# Patient Record
Sex: Female | Born: 1985 | Race: Black or African American | Hispanic: No | Marital: Single | Smoking: Current some day smoker
Health system: Southern US, Community
[De-identification: ages and names within clinical notes are randomized; demographics above are authoritative.]

## PROBLEM LIST (undated history)

## (undated) ENCOUNTER — Ambulatory Visit (HOSPITAL_COMMUNITY): Admission: EM | Payer: 59

## (undated) DIAGNOSIS — T7840XA Allergy, unspecified, initial encounter: Secondary | ICD-10-CM

## (undated) HISTORY — PX: EYE MUSCLE SURGERY: SHX370

## (undated) HISTORY — DX: Allergy, unspecified, initial encounter: T78.40XA

---

## 2004-12-30 ENCOUNTER — Emergency Department (HOSPITAL_COMMUNITY): Admission: EM | Admit: 2004-12-30 | Discharge: 2004-12-30 | Payer: Self-pay | Admitting: Emergency Medicine

## 2007-06-07 ENCOUNTER — Emergency Department (HOSPITAL_COMMUNITY): Admission: EM | Admit: 2007-06-07 | Discharge: 2007-06-07 | Payer: Self-pay | Admitting: Emergency Medicine

## 2007-12-29 ENCOUNTER — Emergency Department (HOSPITAL_COMMUNITY): Admission: EM | Admit: 2007-12-29 | Discharge: 2007-12-30 | Payer: Self-pay | Admitting: Emergency Medicine

## 2008-01-06 ENCOUNTER — Encounter: Admission: RE | Admit: 2008-01-06 | Discharge: 2008-01-06 | Payer: Self-pay | Admitting: Chiropractic Medicine

## 2008-10-25 ENCOUNTER — Emergency Department (HOSPITAL_COMMUNITY): Admission: EM | Admit: 2008-10-25 | Discharge: 2008-10-25 | Payer: Self-pay | Admitting: Emergency Medicine
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
## 2010-02-11 ENCOUNTER — Emergency Department (HOSPITAL_COMMUNITY): Admission: EM | Admit: 2010-02-11 | Discharge: 2010-02-11 | Payer: Self-pay | Admitting: Emergency Medicine

## 2010-09-09 ENCOUNTER — Emergency Department (HOSPITAL_COMMUNITY)
Admission: EM | Admit: 2010-09-09 | Discharge: 2010-09-09 | Payer: Self-pay | Source: Home / Self Care | Admitting: Emergency Medicine

## 2010-09-10 ENCOUNTER — Emergency Department (HOSPITAL_COMMUNITY)
Admission: EM | Admit: 2010-09-10 | Discharge: 2010-09-10 | Payer: Self-pay | Source: Home / Self Care | Admitting: Emergency Medicine

## 2010-09-15 LAB — GLUCOSE, CAPILLARY: Glucose-Capillary: 131 mg/dL — ABNORMAL HIGH (ref 70–99)

## 2010-11-17 LAB — CBC
HCT: 39.2 % (ref 36.0–46.0)
Hemoglobin: 12.6 g/dL (ref 12.0–15.0)
MCHC: 32.3 g/dL (ref 30.0–36.0)
MCV: 82.8 fL (ref 78.0–100.0)
Platelets: 292 10*3/uL (ref 150–400)
RBC: 4.73 MIL/uL (ref 3.87–5.11)
RDW: 16 % — ABNORMAL HIGH (ref 11.5–15.5)
WBC: 9.1 10*3/uL (ref 4.0–10.5)

## 2010-11-17 LAB — BASIC METABOLIC PANEL
BUN: 10 mg/dL (ref 6–23)
CO2: 24 mEq/L (ref 19–32)
Calcium: 9 mg/dL (ref 8.4–10.5)
Chloride: 109 mEq/L (ref 96–112)
Creatinine, Ser: 0.92 mg/dL (ref 0.4–1.2)
GFR calc Af Amer: >60 mL/min (ref >60)
GFR calc non Af Amer: >60 mL/min (ref >60)
Glucose, Bld: 98 mg/dL (ref 70–99)
Potassium: 3.9 mEq/L (ref 3.5–5.1)
Sodium: 139 mEq/L (ref 135–145)

## 2010-11-17 LAB — DIFFERENTIAL
Basophils Absolute: 0 10*3/uL (ref 0.0–0.1)
Basophils Relative: 0 % (ref 0–1)
Eosinophils Absolute: 0.2 10*3/uL (ref 0.0–0.7)
Eosinophils Relative: 3 % (ref 0–5)
Lymphocytes Relative: 37 % (ref 12–46)
Lymphs Abs: 3.4 10*3/uL (ref 0.7–4.0)
Monocytes Absolute: 0.7 10*3/uL (ref 0.1–1.0)
Monocytes Relative: 8 % (ref 3–12)
Neutro Abs: 4.7 10*3/uL (ref 1.7–7.7)
Neutrophils Relative %: 52 % (ref 43–77)

## 2010-11-17 LAB — RAPID URINE DRUG SCREEN, HOSP PERFORMED
Amphetamines: NOT DETECTED
Barbiturates: NOT DETECTED
Benzodiazepines: NOT DETECTED
Cocaine: NOT DETECTED
Opiates: NOT DETECTED
Tetrahydrocannabinol: NOT DETECTED

## 2010-11-17 LAB — URINALYSIS, ROUTINE W REFLEX MICROSCOPIC
Bilirubin Urine: NEGATIVE
Glucose, UA: NEGATIVE mg/dL
Hgb urine dipstick: NEGATIVE
Ketones, ur: NEGATIVE mg/dL
Nitrite: NEGATIVE
Protein, ur: NEGATIVE mg/dL
Specific Gravity, Urine: 1.021 (ref 1.005–1.030)
Urobilinogen, UA: 1 mg/dL (ref 0.0–1.0)
pH: 7 (ref 5.0–8.0)

## 2010-11-17 LAB — POCT PREGNANCY, URINE: Preg Test, Ur: NEGATIVE

## 2016-09-06 ENCOUNTER — Emergency Department (HOSPITAL_COMMUNITY)
Admission: EM | Admit: 2016-09-06 | Discharge: 2016-09-06 | Disposition: A | Discharge status: Home / Self Care | Payer: 59 | Attending: Emergency Medicine | Admitting: Emergency Medicine

## 2016-09-06 ENCOUNTER — Encounter (HOSPITAL_COMMUNITY): Payer: Self-pay

## 2016-09-06 DIAGNOSIS — K029 Dental caries, unspecified: Secondary | ICD-10-CM | POA: Insufficient documentation

## 2016-09-06 DIAGNOSIS — K0889 Other specified disorders of teeth and supporting structures: Secondary | ICD-10-CM | POA: Diagnosis present

## 2016-09-06 DIAGNOSIS — K047 Periapical abscess without sinus: Secondary | ICD-10-CM

## 2016-09-06 DIAGNOSIS — Z87891 Personal history of nicotine dependence: Secondary | ICD-10-CM | POA: Diagnosis not present

## 2016-09-06 MED ORDER — DOXYCYCLINE HYCLATE 100 MG PO CAPS
100.0000 mg | ORAL_CAPSULE | Freq: Two times a day (BID) | ORAL | 0 refills | Status: DC
Start: 2016-09-06 — End: 2019-12-21

## 2016-09-06 NOTE — ED Triage Notes (Signed)
Per pt, sore throat, sinus pressure with headache since Thursday.  Unknown for fever at home.

## 2016-09-06 NOTE — ED Provider Notes (Signed)
[facility]-EMERGENCY DEPT Provider Note   CSN: [id number] Arrival date & time: 09/06/16  1001 By signing my name below, I, Levon HedgerElizabeth Hall, attest that this documentation has been prepared under the direction and in the presence of non-physician practitioner, Roma Bierlein Camprubi-Soms, PA-C. Electronically Signed: Levon HedgerElizabeth Hall, Scribe. 09/06/2016. 11:02 AM.   History   Chief Complaint Chief Complaint  Patient presents with  . Sore Throat  . Headache    HPI Whitney Newman is a 31 y.o. female who presents to the Emergency Department complaining of left lower dental pain that radiates to her left sinus, left ear, and throat onset three days ago. She describes her pain as 7/10, constant, aching pain, radiating to the aforementioned areas, that is occasionally relieved by 800 mg ibuprofen. She reports is exacerbated by cold and laying flat. Pt has also taken 4 of her coworkers clindamycin 300mg  capsules in the past three days. Pt is a former smoker, quit several months ago. She is followed by a dentist, but states has not seen them in a few months. She denies any ear drainage, difficulty swallowing, trismus, drooling, rhinorrhea, fevers, chills, CP, SOB, abd pain, N/V/D/C, hematuria, dysuria, myalgias, arthralgias, numbness, tingling, weakness, or any other complaints at this time.  The history is provided by the patient and medical records. No language interpreter was used.  Dental Pain   This is a new problem. The current episode started more than 2 days ago. The problem occurs constantly. The problem has not changed since onset.The pain is at a severity of 7/10. The pain is moderate. Treatments tried: ibuprofen. The treatment provided mild relief.    History reviewed. No pertinent past medical history.  There are no active problems to display for this patient.   History reviewed. No pertinent surgical history.  OB History    No data available      Home Medications    Prior to Admission  medications   Not on File    Family History History reviewed. No pertinent family history.  Social History Social History  Substance Use Topics  . Smoking status: Former Games developermoker  . Smokeless tobacco: Never Used  . Alcohol use Yes     Comment: social     Allergies   Patient has no known allergies.   Review of Systems Review of Systems  Constitutional: Negative for chills and fever.  HENT: Positive for dental problem, ear pain, sinus pain and sore throat. Negative for ear discharge, rhinorrhea and trouble swallowing.   Respiratory: Negative for shortness of breath.   Cardiovascular: Negative for chest pain.  Gastrointestinal: Negative for abdominal pain, constipation, diarrhea, nausea and vomiting.  Genitourinary: Negative for dysuria and hematuria.  Musculoskeletal: Negative for arthralgias and myalgias.  Skin: Negative for color change.  Allergic/Immunologic: Negative for immunocompromised state.  Neurological: Negative for weakness and numbness.  Psychiatric/Behavioral: Negative for confusion.  10 Systems reviewed and are negative for acute change except as noted in the HPI.   Physical Exam Updated Vital Signs BP 147/87 (BP Location: Left Arm)   Pulse 81   Temp 98.2 F (36.8 C) (Oral)   Resp 18   LMP 08/16/2016   SpO2 100%   Physical Exam  Constitutional: She is oriented to person, place, and time. Vital signs are normal. She appears well-developed and well-nourished.  Non-toxic appearance. No distress.  Afebrile, nontoxic, NAD  HENT:  Head: Normocephalic and atraumatic.  Right Ear: Hearing, tympanic membrane, external ear and ear canal normal.  Left Ear: Hearing, tympanic membrane,  external ear and ear canal normal.  Nose: Nose normal.  Mouth/Throat: Uvula is midline, oropharynx is clear and moist and mucous membranes are normal. No trismus in the jaw. Dental caries present. No dental abscesses or uvula swelling. Tonsils are 0 on the right. Tonsils are 0 on the  left. No tonsillar exudate.  Ears are clear bilaterally. Nose clear. Left lower molar #18 decayed with minimal surrounding gingival swelling and erythema , but no definite dental abscess, no evidence of Ludwig's. Oropharynx clear and moist, without uvular swelling or deviation, no trismus or drooling, no tonsillar swelling or erythema, no exudates.    Eyes: Conjunctivae and EOM are normal. Right eye exhibits no discharge. Left eye exhibits no discharge.  Neck: Normal range of motion. Neck supple.  Cardiovascular: Normal rate and intact distal pulses.   Pulmonary/Chest: Effort normal. No respiratory distress.  Abdominal: Normal appearance. She exhibits no distension.  Musculoskeletal: Normal range of motion.  Neurological: She is alert and oriented to person, place, and time. She has normal strength. No sensory deficit.  Skin: Skin is warm, dry and intact. No rash noted.  Psychiatric: She has a normal mood and affect. Her behavior is normal.  Nursing note and vitals reviewed.  ED Treatments / Results  DIAGNOSTIC STUDIES:  Oxygen Saturation is 100% on RA, normal by my interpretation.    COORDINATION OF CARE:  11:00 AM Discussed treatment plan which includes abx with pt at bedside and pt agreed to plan.   Labs (all labs ordered are listed, but only abnormal results are displayed) Labs Reviewed - No data to display  EKG  EKG Interpretation None       Radiology No results found.  Procedures Procedures (including critical care time)  Medications Ordered in ED Medications - No data to display   Initial Impression / Assessment and Plan / ED Course  I have reviewed the triage vital signs and the nursing notes.  Pertinent labs & imaging results that were available during my care of the patient were reviewed by me and considered in my medical decision making (see chart for details).  Clinical Course     31 y.o. female here with Dental pain associated with dental decay and  possible dental infection with patient afebrile, non toxic appearing and swallowing secretions well, no evidence of ludwig's. I gave patient referral to dentist and stressed the importance of dental follow up for ultimate management of dental pain.  I have also discussed reasons to return immediately to the ER.  Patient expresses understanding and agrees with plan.  I will also give doxycycline and advised tylenol/motrin/oragel for pain. Also advised pt to never take abx not rx'd to her, and to finish all of the abx as directed.  I personally performed the services described in this documentation, which was scribed in my presence. The recorded information has been reviewed and is accurate.   Final Clinical Impressions(s) / ED Diagnoses   Final diagnoses:  Pain due to dental caries  Dental infection    New Prescriptions New Prescriptions   DOXYCYCLINE (VIBRAMYCIN) 100 MG CAPSULE    Take 1 capsule (100 mg total) by mouth 2 (two) times daily. One po bid x 7 days     [employer]i [employer]uss, PA-C 09/06/16 1109    Vanetta Mulders, MD 09/08/16 2120

## 2016-09-06 NOTE — Discharge Instructions (Signed)
Apply warm compresses to jaw throughout the day. Take antibiotic until finished. Use tylenol or motrin as needed for pain. Perform salt water swishes to help with pain/swelling. Use over the counter oragel as needed for additional relief. Followup with a dentist is very important for ongoing evaluation and management of recurrent dental pain, call the dentist listed above in the next 24-48 hours to schedule ongoing dental care, or use the list below to find a dentist. Return to emergency department for emergent changing or worsening symptoms.  °

## 2016-09-06 NOTE — ED Notes (Signed)
Bed: WTR8 Expected date:  Expected time:  Means of arrival:  Comments: 

## 2019-08-29 ENCOUNTER — Other Ambulatory Visit: Payer: Self-pay

## 2019-08-29 ENCOUNTER — Encounter: Payer: Self-pay | Admitting: Medical

## 2019-08-29 ENCOUNTER — Ambulatory Visit (INDEPENDENT_AMBULATORY_CARE_PROVIDER_SITE_OTHER): Payer: 59 | Admitting: Medical

## 2019-08-29 ENCOUNTER — Other Ambulatory Visit (HOSPITAL_COMMUNITY)
Admission: RE | Admit: 2019-08-29 | Discharge: 2019-08-29 | Disposition: A | Discharge status: Home / Self Care | Payer: 59 | Source: Ambulatory Visit | Attending: Medical | Admitting: Medical

## 2019-08-29 VITALS — BP 120/84 | HR 87 | Temp 98.0°F | Ht 69.0 in | Wt 251.4 lb

## 2019-08-29 DIAGNOSIS — Z720 Tobacco use: Secondary | ICD-10-CM | POA: Insufficient documentation

## 2019-08-29 DIAGNOSIS — Z131 Encounter for screening for diabetes mellitus: Secondary | ICD-10-CM | POA: Diagnosis not present

## 2019-08-29 DIAGNOSIS — Z0000 Encounter for general adult medical examination without abnormal findings: Secondary | ICD-10-CM

## 2019-08-29 DIAGNOSIS — M542 Cervicalgia: Secondary | ICD-10-CM

## 2019-08-29 DIAGNOSIS — Z113 Encounter for screening for infections with a predominantly sexual mode of transmission: Secondary | ICD-10-CM | POA: Diagnosis present

## 2019-08-29 DIAGNOSIS — Z124 Encounter for screening for malignant neoplasm of cervix: Secondary | ICD-10-CM | POA: Diagnosis present

## 2019-08-29 DIAGNOSIS — Z7185 Encounter for immunization safety counseling: Secondary | ICD-10-CM | POA: Insufficient documentation

## 2019-08-29 DIAGNOSIS — Z13220 Encounter for screening for lipoid disorders: Secondary | ICD-10-CM

## 2019-08-29 DIAGNOSIS — Z2821 Immunization not carried out because of patient refusal: Secondary | ICD-10-CM | POA: Insufficient documentation

## 2019-08-29 DIAGNOSIS — D649 Anemia, unspecified: Secondary | ICD-10-CM

## 2019-08-29 DIAGNOSIS — Z7189 Other specified counseling: Secondary | ICD-10-CM

## 2019-08-29 DIAGNOSIS — Z833 Family history of diabetes mellitus: Secondary | ICD-10-CM

## 2019-08-29 DIAGNOSIS — M62838 Other muscle spasm: Secondary | ICD-10-CM

## 2019-08-29 LAB — POCT URINALYSIS DIP (PROADVANTAGE DEVICE)
Bilirubin, UA: NEGATIVE
Blood, UA: NEGATIVE
Glucose, UA: NEGATIVE mg/dL
Ketones, POC UA: NEGATIVE mg/dL
Leukocytes, UA: NEGATIVE
Nitrite, UA: NEGATIVE
Protein Ur, POC: NEGATIVE mg/dL
Specific Gravity, Urine: 1.02
Urobilinogen, Ur: NEGATIVE
pH, UA: 6 (ref 5.0–8.0)

## 2019-08-29 NOTE — Patient Instructions (Signed)
Preventative Care for Adults - Female   Thank you for coming in for your well visit today, and thank you for trusting [place] with your care!   Maintain regular health and wellness exams:  A routine yearly physical is a good way to check in with your primary care provider about your health and preventive screening. It is also an opportunity to share updates about your health and any concerns you have, and receive a thorough all-over exam.   Most health insurance companies pay for at least some preventative services.  Check with your health plan for specific coverages.  What preventative services do women need?  Adult women should have their weight and blood pressure checked regularly.   Women age 23 and older should have their cholesterol levels checked regularly.  Women should be screened for cervical cancer with a Pap smear and pelvic exam beginning at either age 46, or 3 years after they become sexually activity.    Breast cancer screening generally begins at age 80 with a mammogram and breast exam by your primary care provider.    Beginning at age 63 and continuing to age 76, women should be screened for colorectal cancer.  Certain people may need continued testing until age 64.  Updating vaccinations is part of preventative care.  Vaccinations help protect against diseases such as the flu.  Osteoporosis is a disease in which the bones lose minerals and strength as we age. Women ages 53 and over should discuss this with their caregivers, as should women after menopause who have other risk factors.  Lab tests are generally done as part of preventative care to screen for anemia and blood disorders, to screen for problems with the kidneys and liver, to screen for bladder problems, to check blood sugar, and to check your cholesterol level.  Preventative services generally include counseling about diet, exercise, avoiding tobacco, drugs, excessive alcohol consumption, and sexually transmitted  infections.    Xrays and CT scans are not normally done as a preventative test, and most insurances do not pay for imaging for screening other than as discussed under cancer screens below.   On the other hand, if you have certain medical concerns, imaging may be necessary as a diagnostic test.   Your Medical Team Your medical team starts with [place], your PCP or primary care provider.  Please use our services for your routine care such as physicals, screenings, immunizations, sick visits, and your first stop for general medical concerns.  You can call our number for after hours information for urgent questions that may need attention but cannot wait til the next business day.    Urgent care-urgent cares exist to provide care when your primary care office would typically be closed such as evenings or weekends.   Urgent care is for evaluation of urgent medical problems that do not necessarily require emergency department care, but cannot wait til the next business day when we are open.  Emergency department care-please reserve emergency department care for serious, urgent, possibly life-threatening medical problems.  This includes issues like possible stroke, heart attack, significant injury, mental health crisis, or other urgent need that requires immediate medical attention.     See your dentist office twice yearly for hygiene and cleaning visits.   Brush your teeth and floss your teeth daily.  See your eye doctor yearly for routine eye exam and screenings for glaucoma and retinal disease.  See your gynecologist yearly if you do not have your female/gynecological exams at our  office.     Specific Concerns: Establish with an ophthalmologist for eye care.    Check with [facility] or [facility] care to start with    Vaccines:  Stay up to date with your tetanus shots and other required immunizations. You should have a booster for tetanus every 10 years. Be sure to get your flu shot every year, since  5%-20% of the [place] population comes down with the flu. The flu vaccine changes each year, so being vaccinated once is not enough. Get your shot in the fall, before the flu season peaks.   Other vaccines to consider:  Pneumococcal vaccine to protect against certain types of pneumonia.  This is normally recommended for adults age 60 or older.  However, adults younger than 35 years old with certain underlying conditions such as diabetes, heart or lung disease should also receive the vaccine.  Shingles vaccine to protect against Varicella Zoster if you are older than age 86, or younger than 35 years old with certain underlying illness.  If you have not had the Shingrix vaccine, please call your insurer to inquire about coverage for the Shingrix vaccine given in 2 doses.   Some insurers cover this vaccine after age 52, some cover this after age 56.  If your insurer covers this, then call to schedule appointment to have this vaccine here  Hepatitis A vaccine to protect against a form of infection of the liver by a virus acquired from food.  Hepatitis B vaccine to protect against a form of infection of the liver by a virus acquired from blood or body fluids, particularly if you work in health care.  If you plan to travel internationally, check with your local health department for specific vaccination recommendations.  Human Papilloma Virus or HPV causes cancer of the cervix, and other infections that can be transmitted from person to person. There is a vaccine for HPV, and males should get immunized between the ages of 35 and 59. It requires a series of 3 shots.   Covid/Coronavirus - as the vaccines are becoming available, please consider vaccination if you are a health care worker, first responder, or have significant health problems such as asthma, COPD, heart disease, hypertension, diabetes, obesity, multiple medical problems, over age 36yo, or immunocompromised.     I recommend the following  vaccines: A yearly flu shot which you declined today  I recommend a one time pneumococcal vaccine  Get [place] a copy of your last tetanus booster    What should I know about Cancer screening? Many types of cancers can be detected early and may often be prevented. Lung Cancer  You should be screened every year for lung cancer if: ? You are a current smoker who has smoked for at least 30 years. ? You are a former smoker who has quit within the past 15 years.  Talk to your health care provider about your screening options, when you should start screening, and how often you should be screened.  Breast cancer screening is essential to preventive care for women. All women age 45 and older should perform a breast self-exam every month. At age 90 and older, women should have their caregiver complete a breast exam each year. Women at ages 61 and older should have a mammogram (x-ray film) of the breasts. Your caregiver can discuss how often you need mammograms.    Breast cancer screening   [facility]   Or    [facility]  [phone]         [phone], [address]       [address] [place], [place] [address]        [place] [address]  Cervical cancer screening includes taking a Pap smear (sample of cells examined under a microscope) from the cervix (end of the uterus). It also includes testing for HPV (Human Papilloma Virus, which can cause cervical cancer). Screening and a pelvic exam should begin at age 35, or 3 years after a woman becomes sexually active. Screening should occur every year, with a Pap smear but no HPV testing, up to age 35. After age 35, you should have a Pap smear every 3 years with HPV testing, if no HPV was found previously.   Colorectal Cancer  Routine colorectal cancer screening usually begins at 35 years of age and should be repeated every 5-10 years until you are 35 years old. You may need to be  screened more often if early forms of precancerous polyps or small growths are found. Your health care provider may recommend screening at an earlier age if you have risk factors for colon cancer.  Your health care provider may recommend using home test kits to check for hidden blood in the stool.  A small camera at the end of a tube can be used to examine your colon (sigmoidoscopy or colonoscopy). This checks for the earliest forms of colorectal cancer.  Skin Cancer  Check your skin from head to toe regularly.  Tell your health care provider about any new moles or changes in moles, especially if: ? There is a change in a mole's size, shape, or color. ? You have a mole that is larger than a pencil eraser.  Always use sunscreen. Apply sunscreen liberally and repeat throughout the day.  Protect yourself by wearing long sleeves, pants, a wide-brimmed hat, and sunglasses when outside.   Are you up to date on cancer screenings:  COLON CANCER SCREENING:  N/a  BREAST CANCER SCREENING:  Monthly self breast exam recommended  CERVICAL CANCER SCREENING:  Pap smear done today OVARIAN CANCER SCREENING:  since there are no swabs for this screening, a clinical pelvic exam is needed for screening.  It is recommended to have this done yearly or regularly as part of your exam.     GENERAL RECOMMENDATIONS FOR GOOD HEALTH:  Healthy diet:  Eat a variety of foods, including fruit, vegetables, animal or vegetable protein, such as meat, fish, chicken, and eggs, or beans, lentils, tofu, and grains, such as rice.  Drink plenty of water daily.  Decrease saturated fat in the diet, avoid lots of red meat, processed foods, sweets, fast foods, and fried foods.  Exercise:  Aerobic exercise helps maintain good heart health. At least 30-40 minutes of moderate-intensity exercise is recommended. For example, a brisk walk that increases your heart rate and breathing. This should be done on most days of the week.    Find a type of exercise or a variety of exercises that you enjoy so that it becomes a part of your daily life.  Examples are running, walking, swimming, water aerobics, and biking.  For motivation and support, explore group exercise such as aerobic class, spin class, Zumba, Yoga,or  martial arts, etc.    Set exercise goals for yourself, such as a certain weight goal, walk or run in a race such as a 5k walk/run.  Speak to your primary care provider about exercise goals.  Your weight readings per our records: Wt Readings from Last 3 Encounters:  08/29/19 251 lb 6.4 oz (114 kg)    Body mass index is 37.13 kg/m.    Disease prevention:  If you smoke or chew tobacco, find out from your caregiver how to quit. It can literally save your life, no matter how long you have been a tobacco user. If you do not use tobacco, never begin.   Maintain a healthy diet and normal weight. Increased weight leads to problems with blood pressure and diabetes.   The Body Mass Index or BMI is a way of measuring how much of your body is fat. Having a BMI above 27 increases the risk of heart disease, diabetes, hypertension, stroke and other problems related to obesity. Your caregiver can help determine your BMI and based on it develop an exercise and dietary program to help you achieve or maintain this important measurement at a healthful level.  High blood pressure causes heart and blood vessel problems.  Persistent high blood pressure should be treated with medicine if weight loss and exercise do not work.  Your blood pressure readings per our records:     BP Readings from Last 3 Encounters:  08/29/19 120/84  09/06/16 118/82     Fat and cholesterol leaves deposits in your arteries that can block them. This causes heart disease and vessel disease elsewhere in your body.  If your cholesterol is found to be high, or if you have heart disease or certain other medical conditions, then you may need to have your  cholesterol monitored frequently and be treated with medication.   Ask if you should have a cardiac stress test if your history suggests this. A stress test is a test done on a treadmill that looks for heart disease. This test can find disease prior to there being a problem.    Heart disease screening:  You are having no symptoms, vital signs are normal   Menopause can be associated with physical symptoms and risks. Hormone replacement therapy is available to decrease these. You should talk to your caregiver about whether starting or continuing to take hormones is right for you.   Osteoporosis is a disease in which the bones lose minerals and strength as we age. This can result in serious bone fractures. Risk of osteoporosis can be identified using a bone density scan. Women ages 12 and over should discuss this with their caregivers, as should women after menopause who have other risk factors. Ask your caregiver whether you should be taking a calcium supplement and Vitamin D, to reduce the rate of osteoporosis.   Osteoporosis screening/bone density testing:  [facility]   [phone]          [phone] [address], [place], [address] [place], [place] [address]        [place], [place] [address]    Avoid drinking alcohol in excess (more than two drinks per day).  Avoid use of street drugs. Do not share needles with anyone. Ask for professional help if you need assistance or instructions on stopping the use of alcohol, cigarettes, and/or drugs.  Brush your teeth twice a day with fluoride toothpaste, and floss once a day. Good oral hygiene prevents tooth decay and gum disease. The problems can be painful, unattractive, and can cause other health problems. Visit your dentist for a routine oral  and dental check up and preventive care every 6-12 months.   Safety:  Use seatbelts 100% of the time, whether driving or  as a passenger.  Use safety devices such as hearing protection if you work in environments with loud noise or significant background noise.  Use safety glasses when doing any work that could send debris in to the eyes.  Use a helmet if you ride a bike or motorcycle.  Use appropriate safety gear for contact sports.  Talk to your caregiver about gun safety.  Use sunscreen with a SPF (or skin protection factor) of 15 or greater.  Lighter skinned people are at a greater risk of skin cancer. Don't forget to also wear sunglasses in order to protect your eyes from too much damaging sunlight. Damaging sunlight can accelerate cataract formation.   Keep carbon monoxide and smoke detectors in your home functioning at all times. Change the batteries every 6 months or use a model that plugs into the wall.    Sexual activity: . Sex is a normal part of life and sexual activity can continue into older adulthood for many healthy people.   . If you are having issues related to sexual activity, please follow up to discuss this further.   . If you are not in a monogamous relationship or have more than one partner, please practice safe sex.  Use condoms. Condoms are used for birth control and to help reduce the spread of sexually transmitted infections (or STIs).  Some of the STIs are gonorrhea (the clap), chlamydia, syphilis, trichomonas, herpes, HPV (human papilloma virus) and HIV (human immunodeficiency virus) which causes AIDS. The herpes, HIV and HPV are viral illnesses that have no cure. These can result in disability, cancer and death.   We are able to test for STIs here at our office.

## 2019-08-29 NOTE — Progress Notes (Signed)
Subjective:   HPI  Whitney Newman is a 33 y.o. female who presents for Chief Complaint  Patient presents with  . Annual Exam    with fasting labs   . Neck Pain    right side behind right ear-was seen virtually for it     Patient Care Team: Patient, No Pcp Per as PCP - General (General Practice) Sees dentist Sees eye doctor  Concerns: No primary care in years since teenage years.  Was recently seen by virtual visit for neck pain, was given steroid and muscle relaxer.  She finished the steroid.  Has hx/o eye muscle issue, 2 prior surgeries, and will likely need another eye surgery.  Fasting today for labs.   Thinks she had Tetanus within 10 years during prior arrest.    Gynecological history:  no pregnancies, has female partner x several years.  Not currently sexually active.   Reviewed their medical, surgical, family, social, medication, and allergy history and updated chart as appropriate.  Past Medical History:  Diagnosis Date  . Allergy     Past Surgical History:  Procedure Laterality Date  . EYE MUSCLE SURGERY     x2    Social History   Socioeconomic History  . Marital status: Single    Spouse name: Not on file  . Number of children: Not on file  . Years of education: Not on file  . Highest education level: Not on file  Occupational History  . Not on file  Tobacco Use  . Smoking status: Current Some Day Smoker    Packs/day: 0.25  . Smokeless tobacco: Never Used  Substance and Sexual Activity  . Alcohol use: Yes    Alcohol/week: 3.0 standard drinks    Types: 3 Shots of liquor per week    Comment: social  . Drug use: No  . Sexual activity: Not on file  Other Topics Concern  . Not on file  Social History Narrative   Lives with girlfriend and her 2 children.   Works in a Nurse, learning disability.   Christian.  Exercise - work on the job.     08/2019   Social Determinants of Health   Financial Resource Strain:   . Difficulty of Paying Living  Expenses: Not on file  Food Insecurity:   . Worried About Charity fundraiser in the Last Year: Not on file  . Ran Out of Food in the Last Year: Not on file  Transportation Needs:   . Lack of Transportation (Medical): Not on file  . Lack of Transportation (Non-Medical): Not on file  Physical Activity:   . Days of Exercise per Week: Not on file  . Minutes of Exercise per Session: Not on file  Stress:   . Feeling of Stress : Not on file  Social Connections:   . Frequency of Communication with Friends and Family: Not on file  . Frequency of Social Gatherings with Friends and Family: Not on file  . Attends Religious Services: Not on file  . Active Member of Clubs or Organizations: Not on file  . Attends Archivist Meetings: Not on file  . Marital Status: Not on file  Intimate Partner Violence:   . Fear of Current or Ex-Partner: Not on file  . Emotionally Abused: Not on file  . Physically Abused: Not on file  . Sexually Abused: Not on file    Family History  Problem Relation Age of Onset  . Hypertension Mother   . Arthritis  Mother   . Sleep apnea Mother   . Liver disease Mother   . Hypertension Father   . Diabetes Father   . Kidney disease Father   . Arthritis Father   . Liver disease Sister   . Sleep apnea Brother   . Cancer Maternal Grandmother        cervical  . Cancer Paternal Grandfather        throat  . Sleep apnea Sister   . Hypertension Sister   . Diabetes Sister      Current Outpatient Medications:  .  methocarbamol (ROBAXIN) 500 MG tablet, Take 1,000 mg by mouth 4 (four) times daily., Disp: , Rfl:  .  doxycycline (VIBRAMYCIN) 100 MG capsule, Take 1 capsule (100 mg total) by mouth 2 (two) times daily. One po bid x 7 days (Patient not taking: Reported on 08/29/2019), Disp: 14 capsule, Rfl: 0  No Known Allergies    Review of Systems Constitutional: -fever, -chills, -sweats, -unexpected weight change, -decreased appetite, -fatigue Allergy:  -sneezing, -itching, -congestion Dermatology: -changing moles, --rash, -lumps ENT: -runny nose, -ear pain, -sore throat, -hoarseness, -sinus pain, -teeth pain, - ringing in ears, -hearing loss, -nosebleeds Cardiology: -chest pain, -palpitations, -swelling, -difficulty breathing when lying flat, -waking up short of breath Respiratory: -cough, -shortness of breath, -difficulty breathing with exercise or exertion, -wheezing, -coughing up blood Gastroenterology: -abdominal pain, -nausea, -vomiting, -diarrhea, -constipation, -blood in stool, -changes in bowel movement, -difficulty swallowing or eating Hematology: -bleeding, -bruising  Musculoskeletal: -joint aches, -muscle aches, -joint swelling, -back pain, +neck pain, -cramping, -changes in gait Ophthalmology: denies vision changes, eye redness, itching, discharge Urology: -burning with urination, -difficulty urinating, -blood in urine, -urinary frequency, -urgency, -incontinence Neurology: -headache, -weakness, -tingling, -numbness, -memory loss, -falls, -dizziness Psychology: -depressed mood, -agitation, -sleep problems Breast/gyn: -breast tendnerss, -discharge, -lumps, -vaginal discharge,- irregular periods, -heavy periods     Objective:  BP 120/84   Pulse 87   Temp 98 F (36.7 C)   Ht 5\' 9"  (1.753 m)   Wt 251 lb 6.4 oz (114 kg)   SpO2 100%   BMI 37.13 kg/m   General appearance: alert, no distress, WD/WN, African American female Skin: right dorsal hand just medial to thumb base with 1 cm healing burn wound, otherwise no worrisome lesions HEENT: normocephalic, conjunctiva/corneas normal, sclerae anicteric, PERRLA, EOMi, nares patent, no discharge or erythema, pharynx normal Oral cavity: MMM, tongue normal, teeth normal Neck: tender right lateral neck muscles and base of occiput on right, mild pain with ROM which is relatively full.   No deformity.   otherwise supple, no lymphadenopathy, no thyromegaly, no masses, normal ROM, no  bruits Chest: non tender, normal shape and expansion Heart: RRR, normal S1, S2, no murmurs Lungs: CTA bilaterally, no wheezes, rhonchi, or rales Abdomen: +bs, soft, non tender, non distended, no masses, no hepatomegaly, no splenomegaly, no bruits Back: tender right upper back paraspinal region.  non tender, normal ROM, no scoliosis Musculoskeletal: upper extremities non tender, no obvious deformity, normal ROM throughout, lower extremities non tender, no obvious deformity, normal ROM throughout Extremities: no edema, no cyanosis, no clubbing Pulses: 2+ symmetric, upper and lower extremities, normal cap refill Neurological: alert, oriented x 3, CN2-12 intact, strength normal upper extremities and lower extremities, sensation normal throughout, DTRs 2+ throughout, no cerebellar signs, gait normal Psychiatric: normal affect, behavior normal, pleasant  Breast: nontender, no masses or lumps, no skin changes, no nipple discharge or inversion, no axillary lymphadenopathy Gyn: Normal external genitalia without lesions, vagina with normal mucosa,  cervix without lesions, no cervical motion tenderness, no abnormal vaginal discharge.  Questionable fullness right adnexa, otherwise Uterus and left adnexa not enlarged, nontender, no masses.  Pap performed.  Exam chaperoned by nurse. Rectal: anus normal appearing   Assessment and Plan :   Encounter Diagnoses  Name Primary?  . Encounter for health maintenance examination in adult Yes  . Screening for lipid disorders   . Screening for diabetes mellitus   . Family history of diabetes mellitus (DM)   . Screening for cervical cancer   . Screen for STD (sexually transmitted disease)   . Neck pain   . Neck muscle spasm   . Vaccine counseling   . Influenza vaccination declined   . Tobacco use     Physical exam - discussed and counseled on healthy lifestyle, diet, exercise, preventative care, vaccinations, sick and well care, proper use of emergency dept and  after hours care, and addressed their concerns.    Health screening: Advised they see their eye doctor yearly for routine vision care. Advised they see their dentist yearly for routine dental care including hygiene visits twice yearly.  Discussed STD testing, discussed prevention, condom use, means of transmission  Cancer screening Counseled on self breast exams, mammograms, cervical cancer screening   Vaccinations: Advised yearly influenza vaccine Patient declines influenza vaccine  She notes being up to date on Td vaccine  Separate significant issues discussed: Neck pain and spasm - advised stretching, can c/t robaxin prn, can use heat.  Likely referral to PT pending labs  Advised to stop tobacco.   Sloane was seen today for annual exam and neck pain.  Diagnoses and all orders for this visit:  Encounter for health maintenance examination in adult -     Comprehensive metabolic panel -     CBC with Differential -     Lipid Panel -     Hemoglobin A1c -     HIV regular -     RPR regular -     Hepatitis C antibody -     Hepatitis B surface antigen -     Cytology - PAP([place]) -     TSH regular  Screening for lipid disorders -     Lipid Panel  Screening for diabetes mellitus -     Hemoglobin A1c  Family history of diabetes mellitus (DM) -     Hemoglobin A1c  Screening for cervical cancer -     Cytology - PAP([place])  Screen for STD (sexually transmitted disease) -     HIV regular -     RPR regular -     Hepatitis C antibody -     Hepatitis B surface antigen -     Cytology - PAP([place])  Neck pain  Neck muscle spasm  Vaccine counseling  Influenza vaccination declined  Tobacco use    Follow-up pending labs, yearly for physical

## 2019-08-29 NOTE — Addendum Note (Signed)
Addended by: Edgar Frisk on: 08/29/2019 10:41 AM   Modules accepted: Orders

## 2019-08-30 ENCOUNTER — Other Ambulatory Visit: Payer: Self-pay | Admitting: Medical

## 2019-08-30 LAB — COMPREHENSIVE METABOLIC PANEL
ALT: 7 IU/L (ref 0–32)
AST: 13 IU/L (ref 0–40)
Albumin/Globulin Ratio: 1.2 (ref 1.2–2.2)
Albumin: 4.1 g/dL (ref 3.8–4.8)
Alkaline Phosphatase: 65 IU/L (ref 39–117)
BUN/Creatinine Ratio: 13 (ref 9–23)
BUN: 12 mg/dL (ref 6–20)
Bilirubin Total: 0.2 mg/dL (ref 0.0–1.2)
CO2: 22 mmol/L (ref 20–29)
Calcium: 9.3 mg/dL (ref 8.7–10.2)
Chloride: 106 mmol/L (ref 96–106)
Creatinine, Ser: 0.92 mg/dL (ref 0.57–1.00)
GFR calc Af Amer: 93 mL/min/{1.73_m2} (ref >59)
GFR calc non Af Amer: 81 mL/min/{1.73_m2} (ref >59)
Globulin, Total: 3.3 g/dL (ref 1.5–4.5)
Glucose: 86 mg/dL (ref 65–99)
Potassium: 4.6 mmol/L (ref 3.5–5.2)
Sodium: 138 mmol/L (ref 134–144)
Total Protein: 7.4 g/dL (ref 6.0–8.5)

## 2019-08-30 LAB — CBC WITH DIFFERENTIAL/PLATELET
Basophils Absolute: 0.1 10*3/uL (ref 0.0–0.2)
Basos: 1 %
EOS (ABSOLUTE): 0.1 10*3/uL (ref 0.0–0.4)
Eos: 2 %
Hematocrit: 28.8 % — ABNORMAL LOW (ref 34.0–46.6)
Hemoglobin: 7.6 g/dL — ABNORMAL LOW (ref 11.1–15.9)
Immature Grans (Abs): 0 10*3/uL (ref 0.0–0.1)
Immature Granulocytes: 0 %
Lymphocytes Absolute: 2.8 10*3/uL (ref 0.7–3.1)
Lymphs: 44 %
MCH: 17 pg — ABNORMAL LOW (ref 26.6–33.0)
MCHC: 26.4 g/dL — ABNORMAL LOW (ref 31.5–35.7)
MCV: 65 fL — ABNORMAL LOW (ref 79–97)
Monocytes Absolute: 0.7 10*3/uL (ref 0.1–0.9)
Monocytes: 11 %
Neutrophils Absolute: 2.6 10*3/uL (ref 1.4–7.0)
Neutrophils: 42 %
Platelets: 679 10*3/uL — ABNORMAL HIGH (ref 150–450)
RBC: 4.46 x10E6/uL (ref 3.77–5.28)
RDW: 19.1 % — ABNORMAL HIGH (ref 11.7–15.4)
WBC: 6.3 10*3/uL (ref 3.4–10.8)

## 2019-08-30 LAB — CYTOLOGY - PAP
Chlamydia: NEGATIVE
Comment: NEGATIVE
Comment: NEGATIVE
Comment: NORMAL
Diagnosis: NEGATIVE
High risk HPV: NEGATIVE
Neisseria Gonorrhea: NEGATIVE

## 2019-08-30 LAB — HEMOGLOBIN A1C
Est. average glucose Bld gHb Est-mCnc: 117 mg/dL
Hgb A1c MFr Bld: 5.7 % — ABNORMAL HIGH (ref 4.8–5.6)

## 2019-08-30 LAB — LIPID PANEL
Chol/HDL Ratio: 3.8 ratio (ref 0.0–4.4)
Cholesterol, Total: 204 mg/dL — ABNORMAL HIGH (ref 100–199)
HDL: 54 mg/dL (ref >39)
LDL Chol Calc (NIH): 141 mg/dL — ABNORMAL HIGH (ref 0–99)
Triglycerides: 48 mg/dL (ref 0–149)
VLDL Cholesterol Cal: 9 mg/dL (ref 5–40)

## 2019-08-30 LAB — HEPATITIS C ANTIBODY: Hep C Virus Ab: 0.1 s/co ratio (ref 0.0–0.9)

## 2019-08-30 LAB — HEPATITIS B SURFACE ANTIGEN: Hepatitis B Surface Ag: NEGATIVE

## 2019-08-30 LAB — TSH: TSH: 1.91 u[IU]/mL (ref 0.450–4.500)

## 2019-08-30 LAB — RPR: RPR Ser Ql: NONREACTIVE

## 2019-08-30 LAB — HIV ANTIBODY (ROUTINE TESTING W REFLEX): HIV Screen 4th Generation wRfx: NONREACTIVE

## 2019-08-30 MED ORDER — FERROUS GLUCONATE 324 (38 FE) MG PO TABS: 324.0000 mg | ORAL_TABLET | Freq: Three times a day (TID) | ORAL | 3 refills | Status: DC

## 2019-08-31 ENCOUNTER — Other Ambulatory Visit: Payer: Self-pay | Admitting: Medical

## 2019-08-31 MED ORDER — METRONIDAZOLE 500 MG PO TABS: ORAL_TABLET | ORAL | 0 refills | Status: DC

## 2019-09-05 DIAGNOSIS — D649 Anemia, unspecified: Secondary | ICD-10-CM | POA: Insufficient documentation

## 2019-09-07 LAB — SPECIMEN STATUS REPORT

## 2019-09-07 LAB — FERRITIN: Ferritin: 2 ng/mL — ABNORMAL LOW (ref 15–150)

## 2019-09-07 LAB — VITAMIN B12: Vitamin B-12: 442 pg/mL (ref 232–1245)

## 2019-09-07 LAB — IRON: Iron: 12 ug/dL — ABNORMAL LOW (ref 27–159)

## 2019-12-20 ENCOUNTER — Ambulatory Visit
Admission: RE | Admit: 2019-12-20 | Discharge: 2019-12-20 | Disposition: A | Discharge status: Home / Self Care | Payer: 59 | Source: Ambulatory Visit | Attending: Medical | Admitting: Medical

## 2019-12-20 ENCOUNTER — Ambulatory Visit: Payer: 59 | Admitting: Medical

## 2019-12-20 ENCOUNTER — Other Ambulatory Visit: Payer: Self-pay

## 2019-12-20 ENCOUNTER — Encounter: Payer: Self-pay | Admitting: Medical

## 2019-12-20 VITALS — BP 120/88 | HR 78 | Temp 98.1°F | Ht 69.0 in | Wt 254.6 lb

## 2019-12-20 DIAGNOSIS — R102 Pelvic and perineal pain: Secondary | ICD-10-CM | POA: Diagnosis not present

## 2019-12-20 DIAGNOSIS — N939 Abnormal uterine and vaginal bleeding, unspecified: Secondary | ICD-10-CM | POA: Diagnosis not present

## 2019-12-20 DIAGNOSIS — F17200 Nicotine dependence, unspecified, uncomplicated: Secondary | ICD-10-CM

## 2019-12-20 DIAGNOSIS — R1900 Intra-abdominal and pelvic swelling, mass and lump, unspecified site: Secondary | ICD-10-CM | POA: Diagnosis not present

## 2019-12-20 DIAGNOSIS — D509 Iron deficiency anemia, unspecified: Secondary | ICD-10-CM | POA: Insufficient documentation

## 2019-12-20 LAB — POCT URINALYSIS DIP (PROADVANTAGE DEVICE)
Bilirubin, UA: NEGATIVE
Glucose, UA: NEGATIVE mg/dL
Ketones, POC UA: NEGATIVE mg/dL
Leukocytes, UA: NEGATIVE
Nitrite, UA: NEGATIVE
Specific Gravity, Urine: 1.03
Urobilinogen, Ur: NEGATIVE
pH, UA: 6 (ref 5.0–8.0)

## 2019-12-20 LAB — CBC WITH DIFFERENTIAL/PLATELET
EOS (ABSOLUTE): 0.1 10*3/uL (ref 0.0–0.4)
Hematocrit: 35.4 % (ref 34.0–46.6)

## 2019-12-20 NOTE — Addendum Note (Signed)
Addended by: Victorio Palm on: 12/20/2019 10:42 AM   Modules accepted: Orders

## 2019-12-20 NOTE — Progress Notes (Signed)
Subjective: Chief Complaint  Patient presents with  . Vaginal Bleeding    bleeding on and off-not bleeding today    Here for vaginal bleeding.     LMP was 3 weeks ago.  Prior to 3 weeks ago, the last several months she had normal period.    Those were normal in timing and not heavy.   After most recent period, once the menstrual period was over, then a day later bleeding restarted.  Has been bleeding since.  Sometimes spotty, sometimes regular bleeding.  No prior similar.   Has same female sexual partner x 7 years, no concern for STD.    Takes iron on average 4 days per week.  No recent change in diet, exercise.     Sometimes smoker.  Not on OCPs.     No other bleeding or bruising  She was anemic in 08/2019. We started her on iron and she was suppose to return in 1 month but didn't return until now to recheck on this.      Past Medical History:  Diagnosis Date  . Allergy    Current Outpatient Medications on File Prior to Visit  Medication Sig Dispense Refill  . ferrous gluconate (FERGON) 324 MG tablet Take 1 tablet (324 mg total) by mouth 3 (three) times daily with meals. 90 tablet 3  . doxycycline (VIBRAMYCIN) 100 MG capsule Take 1 capsule (100 mg total) by mouth 2 (two) times daily. One po bid x 7 days (Patient not taking: Reported on 08/29/2019) 14 capsule 0  . methocarbamol (ROBAXIN) 500 MG tablet Take 1,000 mg by mouth 4 (four) times daily.    . metroNIDAZOLE (FLAGYL) 500 MG tablet 4 tablets once, don't consume alcohol while taking the medication (Patient not taking: Reported on 12/20/2019) 4 tablet 0   No current facility-administered medications on file prior to visit.   Past Surgical History:  Procedure Laterality Date  . EYE MUSCLE SURGERY     x2    ROS as in subjective    Objective: BP 120/88   Pulse 78   Temp 98.1 F (36.7 C)   Ht 5\' 9"  (1.753 m)   Wt 254 lb 9.6 oz (115.5 kg)   SpO2 98%   BMI 37.60 kg/m   Wt Readings from Last 3 Encounters:  12/20/19  254 lb 9.6 oz (115.5 kg)  08/29/19 251 lb 6.4 oz (114 kg)   Gen: wd, wn, na Abdomen: +bs, soft, mild right lower pelvic tendnerss, otherwise nontender, no mass, no organomegaly Back nontender      Assessment: Encounter Diagnoses  Name Primary?  . Vaginal bleeding Yes  . Iron deficiency anemia, unspecified iron deficiency anemia type   . Pelvic fullness in female   . Smoker   . Pelvic pain      Plan: Discussed symptoms, anemia found in 08/2019, possible differential.   Will set up for Ultrasound pelvis, labs today, and will send home with stool cards x 3.   Whitney Newman was seen today for vaginal bleeding.  Diagnoses and all orders for this visit:  Vaginal bleeding -     US Pelvic Complete With Transvaginal; Future  Iron deficiency anemia, unspecified iron deficiency anemia type -     CBC with Differential/Platelet -     Iron and TIBC -     Ferritin  Pelvic fullness in female -     US Pelvic Complete With Transvaginal; Future  Smoker -     CBC with Differential/Platelet -  US Pelvic Complete With Transvaginal; Future  Pelvic pain -     US Pelvic Complete With Transvaginal; Future  f/u pending labs, [place]

## 2019-12-21 ENCOUNTER — Other Ambulatory Visit: Payer: Self-pay | Admitting: Medical

## 2019-12-21 LAB — CBC WITH DIFFERENTIAL/PLATELET
Basophils Absolute: 0.1 10*3/uL (ref 0.0–0.2)
Basos: 1 %
Eos: 2 %
Hemoglobin: 11.1 g/dL (ref 11.1–15.9)
Immature Grans (Abs): 0 10*3/uL (ref 0.0–0.1)
Immature Granulocytes: 0 %
Lymphocytes Absolute: 3.3 10*3/uL — ABNORMAL HIGH (ref 0.7–3.1)
Lymphs: 44 %
MCH: 23.9 pg — ABNORMAL LOW (ref 26.6–33.0)
MCHC: 31.4 g/dL — ABNORMAL LOW (ref 31.5–35.7)
MCV: 76 fL — ABNORMAL LOW (ref 79–97)
Monocytes Absolute: 0.8 10*3/uL (ref 0.1–0.9)
Monocytes: 10 %
Neutrophils Absolute: 3.3 10*3/uL (ref 1.4–7.0)
Neutrophils: 43 %
Platelets: 365 10*3/uL (ref 150–450)
RBC: 4.64 x10E6/uL (ref 3.77–5.28)
RDW: 16.1 % — ABNORMAL HIGH (ref 11.7–15.4)
WBC: 7.6 10*3/uL (ref 3.4–10.8)

## 2019-12-21 LAB — IRON AND TIBC
Iron Saturation: 88 % (ref 15–55)
Iron: 277 ug/dL (ref 27–159)
Total Iron Binding Capacity: 313 ug/dL (ref 250–450)
UIBC: 36 ug/dL — ABNORMAL LOW (ref 131–425)

## 2019-12-21 LAB — FERRITIN: Ferritin: 8 ng/mL — ABNORMAL LOW (ref 15–150)

## 2019-12-22 LAB — HEPATIC FUNCTION PANEL
ALT: 16 IU/L (ref 0–32)
AST: 23 IU/L (ref 0–40)
Albumin: 4.2 g/dL (ref 3.8–4.8)
Alkaline Phosphatase: 66 IU/L (ref 39–117)
Bilirubin Total: <0.2 mg/dL (ref 0.0–1.2)
Bilirubin, Direct: 0.07 mg/dL (ref 0.00–0.40)
Total Protein: 7.7 g/dL (ref 6.0–8.5)

## 2019-12-22 LAB — SPECIMEN STATUS REPORT

## 2020-07-31 ENCOUNTER — Ambulatory Visit (HOSPITAL_COMMUNITY)
Admission: EM | Admit: 2020-07-31 | Discharge: 2020-07-31 | Disposition: A | Discharge status: Home / Self Care | Payer: 59 | Attending: Family Medicine | Admitting: Family Medicine

## 2020-07-31 ENCOUNTER — Encounter (HOSPITAL_COMMUNITY): Payer: Self-pay

## 2020-07-31 ENCOUNTER — Other Ambulatory Visit: Payer: Self-pay

## 2020-07-31 DIAGNOSIS — T50B95A Adverse effect of other viral vaccines, initial encounter: Secondary | ICD-10-CM

## 2020-07-31 DIAGNOSIS — R52 Pain, unspecified: Secondary | ICD-10-CM

## 2020-07-31 DIAGNOSIS — R110 Nausea: Secondary | ICD-10-CM

## 2020-07-31 DIAGNOSIS — R5383 Other fatigue: Secondary | ICD-10-CM

## 2020-07-31 MED ORDER — ONDANSETRON HCL 4 MG PO TABS: 4.0000 mg | ORAL_TABLET | Freq: Three times a day (TID) | ORAL | 0 refills | Status: DC | PRN

## 2020-07-31 NOTE — ED Triage Notes (Signed)
Pt in with c/o N/V, weakness and body aches that started yesterday a few hours after she received her covid vaccine.  Denies any SOB or hx of vaccine reactions

## 2020-07-31 NOTE — Discharge Instructions (Signed)
Zofran every 8 hours as needed for nausea or vomiting.   Rest Plenty of fluids.  Tylenol and/or ibuprofen as needed for pain or fevers.  I would expect improvement/ resolution in the next 24 hours

## 2020-07-31 NOTE — ED Provider Notes (Signed)
[facility]    CSN: [id number] Arrival date & time: 07/31/20  1254      History   Chief Complaint Chief Complaint  Patient presents with  . Nausea  . Generalized Body Aches  . Fatigue    HPI ROSCHELLE Newman is a 34 y.o. female.   Whitney Newman [facility] presents with complaints of fatigue, nausea and vomiting, body aches, which started yesterday shortly after getting her second covid-19 vaccine. No further emesis today but still with nausea. She is requiring a note to provide to work as she didn't go in today due to feeling so unwell. No known ill contacts. No fevers.    ROS per HPI, negative if not otherwise mentioned.      Past Medical History:  Diagnosis Date  . Allergy     Patient Active Problem List   Diagnosis Date Noted  . Vaginal bleeding 12/20/2019  . Iron deficiency anemia 12/20/2019  . Pelvic fullness in female 12/20/2019  . Smoker 12/20/2019  . Pelvic pain 12/20/2019  . Anemia 09/05/2019  . Screen for STD (sexually transmitted disease) 08/29/2019  . Family history of diabetes mellitus (DM) 08/29/2019  . Screening for cervical cancer 08/29/2019  . Neck pain 08/29/2019  . Neck muscle spasm 08/29/2019  . Vaccine counseling 08/29/2019  . Influenza vaccination declined 08/29/2019  . Tobacco use 08/29/2019    Past Surgical History:  Procedure Laterality Date  . EYE MUSCLE SURGERY     x2    OB History   No obstetric history on file.      Home Medications    Prior to Admission medications   Medication Sig Start Date End Date Taking? Authorizing Provider  methocarbamol (ROBAXIN) 500 MG tablet Take 1,000 mg by mouth 4 (four) times daily. 07/31/19   [provider]  ondansetron (ZOFRAN) 4 MG tablet Take 1 tablet (4 mg total) by mouth every 8 (eight) hours as needed for nausea or vomiting. 07/31/20   Georgetta Haber, NP    Family History Family History  Problem Relation Age of Onset  . Hypertension Mother   . Arthritis Mother   .  Sleep apnea Mother   . Liver disease Mother   . Hypertension Father   . Diabetes Father   . Kidney disease Father   . Arthritis Father   . Liver disease Sister   . Sleep apnea Brother   . Cancer Maternal Grandmother        cervical  . Cancer Paternal Grandfather        throat  . Sleep apnea Sister   . Hypertension Sister   . Diabetes Sister     Social History Social History   Tobacco Use  . Smoking status: Current Some Day Smoker    Packs/day: 0.25  . Smokeless tobacco: Never Used  Substance Use Topics  . Alcohol use: Yes    Alcohol/week: 3.0 standard drinks    Types: 3 Shots of liquor per week    Comment: social  . Drug use: No     Allergies   Patient has no known allergies.   Review of Systems Review of Systems   Physical Exam Triage Vital Signs ED Triage Vitals  Enc Vitals Group     BP 07/31/20 1402 (!) 142/87     Pulse Rate 07/31/20 1402 94     Resp 07/31/20 1402 20     Temp 07/31/20 1402 98 F (36.7 C)     Temp Source 07/31/20 1402 Oral  SpO2 07/31/20 1402 100 %     Weight --      Height --      Head Circumference --      Peak Flow --      Pain Score 07/31/20 1401 6     Pain Loc --      Pain Edu? --      Excl. in GC? --    No data found.  Updated Vital Signs BP (!) 142/87 (BP Location: Right Arm)   Pulse 94   Temp 98 F (36.7 C) (Oral)   Resp 20   LMP 07/17/2020 (Approximate)   SpO2 100%   Visual Acuity Right Eye Distance:   Left Eye Distance:   Bilateral Distance:    Right Eye Near:   Left Eye Near:    Bilateral Near:     Physical Exam Constitutional:      General: She is not in acute distress.    Appearance: She is well-developed.  Cardiovascular:     Rate and Rhythm: Normal rate.  Pulmonary:     Effort: Pulmonary effort is normal.  Skin:    General: Skin is warm and dry.  Neurological:     Mental Status: She is alert and oriented to person, place, and time.      UC Treatments / Results  Labs (all labs  ordered are listed, but only abnormal results are displayed) Labs Reviewed - No data to display  EKG   Radiology No results found.  Procedures Procedures (including critical care time)  Medications Ordered in UC Medications - No data to display  Initial Impression / Assessment and Plan / UC Course  I have reviewed the triage vital signs and the nursing notes.  Pertinent labs & imaging results that were available during my care of the patient were reviewed by me and considered in my medical decision making (see chart for details).     Consistent with response from second covid vaccine. Rest, hydration, nsaids/tylenol prn, zofran prn. Work note provided. Expectations of improvement discussed. Patient verbalized understanding and agreeable to plan.   Final Clinical Impressions(s) / UC Diagnoses   Final diagnoses:  Nausea  Fatigue, unspecified type  Body aches  Fatigue after COVID-19 vaccination     Discharge Instructions     Zofran every 8 hours as needed for nausea or vomiting.   Rest Plenty of fluids.  Tylenol and/or ibuprofen as needed for pain or fevers.  I would expect improvement/ resolution in the next 24 hours    ED Prescriptions    Medication Sig Dispense Auth. Provider   ondansetron (ZOFRAN) 4 MG tablet Take 1 tablet (4 mg total) by mouth every 8 (eight) hours as needed for nausea or vomiting. 10 tablet Georgetta Haber, NP     PDMP not reviewed this encounter.   Georgetta Haber, NP 07/31/20 1432

## 2021-07-29 ENCOUNTER — Other Ambulatory Visit: Payer: Self-pay

## 2021-09-30 ENCOUNTER — Other Ambulatory Visit: Payer: Self-pay

## 2021-09-30 ENCOUNTER — Encounter (HOSPITAL_BASED_OUTPATIENT_CLINIC_OR_DEPARTMENT_OTHER): Payer: Self-pay | Admitting: Emergency Medicine

## 2021-09-30 ENCOUNTER — Observation Stay (HOSPITAL_BASED_OUTPATIENT_CLINIC_OR_DEPARTMENT_OTHER)
Admission: EM | Admit: 2021-09-30 | Discharge: 2021-10-02 | Disposition: A | Discharge status: Home / Self Care | Payer: Managed Care, Other (non HMO) | Attending: Internal Medicine | Admitting: Internal Medicine

## 2021-09-30 DIAGNOSIS — F17200 Nicotine dependence, unspecified, uncomplicated: Secondary | ICD-10-CM | POA: Insufficient documentation

## 2021-09-30 DIAGNOSIS — E876 Hypokalemia: Secondary | ICD-10-CM | POA: Diagnosis not present

## 2021-09-30 DIAGNOSIS — Z20822 Contact with and (suspected) exposure to covid-19: Secondary | ICD-10-CM | POA: Insufficient documentation

## 2021-09-30 DIAGNOSIS — D509 Iron deficiency anemia, unspecified: Secondary | ICD-10-CM | POA: Diagnosis present

## 2021-09-30 DIAGNOSIS — K2950 Unspecified chronic gastritis without bleeding: Secondary | ICD-10-CM | POA: Insufficient documentation

## 2021-09-30 DIAGNOSIS — K648 Other hemorrhoids: Secondary | ICD-10-CM | POA: Diagnosis not present

## 2021-09-30 DIAGNOSIS — N939 Abnormal uterine and vaginal bleeding, unspecified: Secondary | ICD-10-CM | POA: Diagnosis present

## 2021-09-30 DIAGNOSIS — K922 Gastrointestinal hemorrhage, unspecified: Secondary | ICD-10-CM

## 2021-09-30 DIAGNOSIS — D649 Anemia, unspecified: Secondary | ICD-10-CM | POA: Diagnosis present

## 2021-09-30 LAB — COMPREHENSIVE METABOLIC PANEL
ALT: 9 U/L (ref 0–44)
AST: 14 U/L — ABNORMAL LOW (ref 15–41)
Albumin: 4.1 g/dL (ref 3.5–5.0)
Alkaline Phosphatase: 59 U/L (ref 38–126)
Anion gap: 9 (ref 5–15)
BUN: 11 mg/dL (ref 6–20)
CO2: 23 mmol/L (ref 22–32)
Calcium: 9.1 mg/dL (ref 8.9–10.3)
Chloride: 107 mmol/L (ref 98–111)
Creatinine, Ser: 0.92 mg/dL (ref 0.44–1.00)
GFR, Estimated: >60 mL/min (ref >60)
Glucose, Bld: 136 mg/dL — ABNORMAL HIGH (ref 70–99)
Potassium: 3.4 mmol/L — ABNORMAL LOW (ref 3.5–5.1)
Sodium: 139 mmol/L (ref 135–145)
Total Bilirubin: 0.3 mg/dL (ref 0.3–1.2)
Total Protein: 7.7 g/dL (ref 6.5–8.1)

## 2021-09-30 LAB — CBC
HCT: 21.6 % — ABNORMAL LOW (ref 36.0–46.0)
Hemoglobin: 5.4 g/dL — CL (ref 12.0–15.0)
MCH: 14.7 pg — ABNORMAL LOW (ref 26.0–34.0)
MCHC: 25 g/dL — ABNORMAL LOW (ref 30.0–36.0)
MCV: 58.9 fL — ABNORMAL LOW (ref 80.0–100.0)
Platelets: 639 10*3/uL — ABNORMAL HIGH (ref 150–400)
RBC: 3.67 MIL/uL — ABNORMAL LOW (ref 3.87–5.11)
RDW: 21.1 % — ABNORMAL HIGH (ref 11.5–15.5)
WBC: 6.4 10*3/uL (ref 4.0–10.5)
nRBC: 0 % (ref 0.0–0.2)

## 2021-09-30 LAB — ABO/RH: ABO/RH(D): O POS

## 2021-09-30 LAB — RESP PANEL BY RT-PCR (FLU A&B, COVID) ARPGX2
Influenza A by PCR: NEGATIVE
Influenza B by PCR: NEGATIVE
SARS Coronavirus 2 by RT PCR: NEGATIVE

## 2021-09-30 LAB — HIV ANTIBODY (ROUTINE TESTING W REFLEX): HIV Screen 4th Generation wRfx: NONREACTIVE

## 2021-09-30 LAB — PREGNANCY, URINE: Preg Test, Ur: NEGATIVE

## 2021-09-30 LAB — PREPARE RBC (CROSSMATCH)

## 2021-09-30 LAB — IRON AND TIBC
Iron: 15 ug/dL — ABNORMAL LOW (ref 28–170)
Saturation Ratios: 4 % — ABNORMAL LOW (ref 10.4–31.8)
TIBC: 396 ug/dL (ref 250–450)
UIBC: 381 ug/dL

## 2021-09-30 LAB — FERRITIN: Ferritin: 1 ng/mL — ABNORMAL LOW (ref 11–307)

## 2021-09-30 LAB — OCCULT BLOOD X 1 CARD TO LAB, STOOL: Fecal Occult Bld: POSITIVE — AB

## 2021-09-30 MED ORDER — SODIUM CHLORIDE 0.9% IV SOLUTION: Freq: Once | INTRAVENOUS | Status: AC

## 2021-09-30 MED ORDER — PREGABALIN 75 MG PO CAPS
150.0000 mg | ORAL_CAPSULE | Freq: Two times a day (BID) | ORAL | Status: DC
  Administered 2021-09-30 – 2021-10-01 (×3): 150 mg via ORAL
  Filled 2021-09-30 (×4): qty 2

## 2021-09-30 MED ORDER — POTASSIUM CHLORIDE CRYS ER 20 MEQ PO TBCR
40.0000 meq | EXTENDED_RELEASE_TABLET | Freq: Once | ORAL | Status: AC
  Administered 2021-09-30: 40 meq via ORAL
  Filled 2021-09-30: qty 2

## 2021-09-30 MED ORDER — POLYETHYLENE GLYCOL 3350 17 G PO PACK: 17.0000 g | PACK | Freq: Every day | ORAL | Status: DC | PRN

## 2021-09-30 MED ORDER — ACETAMINOPHEN 650 MG RE SUPP: 650.0000 mg | Freq: Four times a day (QID) | RECTAL | Status: DC | PRN

## 2021-09-30 MED ORDER — ACETAMINOPHEN 325 MG PO TABS: 650.0000 mg | ORAL_TABLET | Freq: Four times a day (QID) | ORAL | Status: DC | PRN

## 2021-09-30 MED ORDER — SODIUM CHLORIDE 0.9% FLUSH
3.0000 mL | Freq: Two times a day (BID) | INTRAVENOUS | Status: DC
  Administered 2021-09-30 – 2021-10-01 (×2): 3 mL via INTRAVENOUS

## 2021-09-30 NOTE — ED Provider Notes (Addendum)
[place] [facility] Provider Note   CSN: [id number] Arrival date & time: 09/30/21  1307     History  Chief Complaint  Patient presents with   Abnormal Lab    Hgb 5, per pt.     Whitney Newman is a 36 y.o. female.  Patient is a 36 yo female with PMH iron deficiency anemia presenting for anemia. Pt sent in by pcp Dr. Laverta Baltimore for yearly physical with abnormal hemoglobin of 5. Patient admits to currently being on menstrual cycle, day 2, saturating tampons every 1-2 hours and using pads with tampons due to heavy bleeding. Denies rectal bleeding, blood in stool, or black stools.   The history is provided by the patient. No language interpreter was used.  Abnormal Lab     Home Medications Prior to Admission medications   Medication Sig Start Date End Date Taking? Authorizing Provider  methocarbamol (ROBAXIN) 500 MG tablet Take 1,000 mg by mouth 4 (four) times daily. 07/31/19   [provider]  ondansetron (ZOFRAN) 4 MG tablet Take 1 tablet (4 mg total) by mouth every 8 (eight) hours as needed for nausea or vomiting. 07/31/20   Zigmund Gottron, NP      Allergies    Patient has no known allergies.    Review of Systems   Review of Systems  Constitutional:  Negative for chills and fever.  HENT:  Negative for ear pain and sore throat.   Eyes:  Negative for pain and visual disturbance.  Respiratory:  Negative for cough and shortness of breath.   Cardiovascular:  Negative for chest pain and palpitations.  Gastrointestinal:  Negative for abdominal pain and vomiting.  Genitourinary:  Positive for vaginal bleeding. Negative for dysuria and hematuria.  Musculoskeletal:  Negative for arthralgias and back pain.  Skin:  Negative for color change and rash.  Neurological:  Negative for seizures and syncope.  All other systems reviewed and are negative.  Physical Exam Updated Vital Signs BP 129/67 (BP Location: Right Arm)    Pulse 100    Temp 98.9 F (37.2 C)  (Oral)    Resp 18    Ht 5\' 9"  (1.753 m)    Wt 118.2 kg    LMP 09/30/2021    SpO2 100%    BMI 38.50 kg/m  Physical Exam Vitals and nursing note reviewed.  Constitutional:      General: She is not in acute distress.    Appearance: She is well-developed.  HENT:     Head: Normocephalic and atraumatic.  Eyes:     Conjunctiva/sclera: Conjunctivae normal.  Cardiovascular:     Rate and Rhythm: Normal rate and regular rhythm.     Heart sounds: No murmur heard. Pulmonary:     Effort: Pulmonary effort is normal. No respiratory distress.     Breath sounds: Normal breath sounds.  Abdominal:     Palpations: Abdomen is soft.     Tenderness: There is no abdominal tenderness.  Musculoskeletal:        General: No swelling.     Cervical back: Neck supple.  Skin:    General: Skin is warm and dry.     Capillary Refill: Capillary refill takes less than 2 seconds.  Neurological:     Mental Status: She is alert.  Psychiatric:        Mood and Affect: Mood normal.    ED Results / Procedures / Treatments   Labs (all labs ordered are listed, but only abnormal results are displayed)  Labs Reviewed  CBC - Abnormal; Notable for the following components:      Result Value   RBC 3.67 (*)    Hemoglobin 5.4 (*)    HCT 21.6 (*)    MCV 58.9 (*)    MCH 14.7 (*)    MCHC 25.0 (*)    RDW 21.1 (*)    Platelets 639 (*)    All other components within normal limits  PREGNANCY, URINE  COMPREHENSIVE METABOLIC PANEL    EKG None  Radiology No results found.  Procedures .Critical Care Performed by: Lianne Cure, DO Authorized by: Lianne Cure, DO   Critical care provider statement:    Critical care time (minutes):  35   Critical care was necessary to treat or prevent imminent or life-threatening deterioration of the following conditions: anemia 5.4.   Critical care was time spent personally by me on the following activities:  Development of treatment plan with patient or surrogate, discussions with  consultants, evaluation of patient's response to treatment, examination of patient, ordering and review of laboratory studies, ordering and review of radiographic studies, ordering and performing treatments and interventions, pulse oximetry, re-evaluation of patient's condition and review of old charts    Medications Ordered in ED Medications - No data to display  ED Course/ Medical Decision Making/ A&P                           Medical Decision Making Amount and/or Complexity of Data Reviewed Labs: ordered.  Risk Decision regarding hospitalization.   2:30 PM 36 yo female with PMH iron deficiency anemia presenting for anemia secondary to vaginal bleeding.   Hemoglobin 5.4  Rectal hemoccult positive.   Recommend transfer for admission and blood transfusion for anemia secondary to vaginal bleeding, rectal bleeding, and iron def anemia. Pt agreeable to plan. Patient accepted by Dr. Roosevelt Locks.        Final Clinical Impression(s) / ED Diagnoses Final diagnoses:  Vaginal bleeding  Anemia, unspecified type  H/o Iron deficiency anemia, unspecified iron deficiency anemia type  Gastrointestinal hemorrhage, unspecified gastrointestinal hemorrhage type    Rx / DC Orders ED Discharge Orders     None         Lianne Cure, DO [id number]    Lianne Cure, DO [id number]

## 2021-09-30 NOTE — Progress Notes (Signed)
New Admission Note:   Arrival Method: Pt arrived from [facility] ED via [facility] transport Mental Orientation: Alert and oriented x4 Telemetry: Tele box 5W 35 Assessment: Completed Skin: Intact IV: NSL-Lt AC Pain: 0/10 Tubes: N/A Safety Measures: Safety Fall Prevention Plan has been discussed.  Admission: Completed Orientation: Patient has been oriented to the room, unit and staff.  Family: None at bedside  Orders have been reviewed and implemented. Will continue to monitor the patient. Call light has been placed within reach and bed alarm has been activated.   Raylene Carmickle [employer], RN-BC Phone number: [phone]

## 2021-09-30 NOTE — ED Notes (Addendum)
ED TO INPATIENT HANDOFF REPORT  ED Nurse Name and Phone #: Whitney Himebaugh RN   S Name/Age/Gender Laural Newman 36 y.o. female Room/Bed: DB004/DB004  Code Status   Code Status: Not on file  Home/SNF/Other Home Patient oriented to: self, place, time, and situation Is this baseline? Yes   Triage Complete: Triage complete  Chief Complaint Anemia [D64.9]  Triage Note Patient arrives via POV due to having an abnormal hgb. Per pt, she had blood drawn at her pcp during her regular checkup and her hgb level was 5. Prior history of anemia diagnosis, but no history of needing blood.     Allergies No Known Allergies  Level of Care/Admitting Diagnosis ED Disposition     ED Disposition  Admit   Condition  --   Comment  Hospital Area: [facility] [[id number]]  Level of Care: Med-Surg [16]  Interfacility transfer: Yes  May place patient in observation at [facility] or [facility] if equivalent level of care is available:: No  Covid Evaluation: Asymptomatic Screening Protocol (No Symptoms)  Diagnosis: Anemia [id number]  Admitting Physician: Lequita Halt [id number]  Attending Physician: Lequita Halt [id number]          B Medical/Surgery History Past Medical History:  Diagnosis Date   Allergy    Past Surgical History:  Procedure Laterality Date   EYE MUSCLE SURGERY     x2     A IV Location/Drains/Wounds Patient Lines/Drains/Airways Status     Active Line/Drains/Airways     Name Placement date Placement time Site Days   Peripheral IV 09/30/21 20 G 1" Left Antecubital 09/30/21  1506  Antecubital  less than 1            Intake/Output Last 24 hours No intake or output data in the 24 hours ending 09/30/21 1910  Labs/Imaging Results for orders placed or performed during the hospital encounter of 09/30/21 (from the past 48 hour(s))  Pregnancy, urine     Status: None   Collection Time: 09/30/21  1:35 PM  Result Value Ref Range   Preg Test, Ur NEGATIVE  NEGATIVE    Comment:        THE SENSITIVITY OF THIS METHODOLOGY IS >20 mIU/mL. Performed at [facility], [address] [place], [place] [address]   Comprehensive metabolic panel     Status: Abnormal   Collection Time: 09/30/21  1:39 PM  Result Value Ref Range   Sodium 139 135 - 145 mmol/L   Potassium 3.4 (L) 3.5 - 5.1 mmol/L   Chloride 107 98 - 111 mmol/L   CO2 23 22 - 32 mmol/L   Glucose, Bld 136 (H) 70 - 99 mg/dL    Comment: Glucose reference range applies only to samples taken after fasting for at least 8 hours.   BUN 11 6 - 20 mg/dL   Creatinine, Ser 0.92 0.44 - 1.00 mg/dL   Calcium 9.1 8.9 - 10.3 mg/dL   Total Protein 7.7 6.5 - 8.1 g/dL   Albumin 4.1 3.5 - 5.0 g/dL   AST 14 (L) 15 - 41 U/L   ALT 9 0 - 44 U/L   Alkaline Phosphatase 59 38 - 126 U/L   Total Bilirubin 0.3 0.3 - 1.2 mg/dL   GFR, Estimated >60 >60 mL/min    Comment: (NOTE) Calculated using the CKD-EPI Creatinine Equation (2021)    Anion gap 9 5 - 15    Comment: Performed at [facility], [place], [place]  [address]  CBC     Status: Abnormal   Collection Time: 09/30/21  1:39 PM  Result Value Ref Range   WBC 6.4 4.0 - 10.5 K/uL   RBC 3.67 (L) 3.87 - 5.11 MIL/uL   Hemoglobin 5.4 (LL) 12.0 - 15.0 g/dL    Comment: Reticulocyte Hemoglobin testing may be clinically indicated, consider ordering this additional test [id number] THIS CRITICAL RESULT HAS VERIFIED AND BEEN CALLED TO BARBER,M,RN BY PHYLLIS GWYN ON 01 31 2023 AT 1403, AND HAS BEEN READ BACK.     HCT 21.6 (L) 36.0 - 46.0 %   MCV 58.9 (L) 80.0 - 100.0 fL   MCH 14.7 (L) 26.0 - 34.0 pg   MCHC 25.0 (L) 30.0 - 36.0 g/dL   RDW 21.1 (H) 11.5 - 15.5 %   Platelets 639 (H) 150 - 400 K/uL   nRBC 0.0 0.0 - 0.2 %    Comment: Performed at [facility], [address] [place], [place] [address]  Occult blood card to lab, stool     Status: Abnormal   Collection Time: 09/30/21  2:31 PM   Result Value Ref Range   Fecal Occult Bld POSITIVE (A) NEGATIVE    Comment: Performed at [facility], [address], [place], [place] [address]  Resp Panel by RT-PCR (Flu A&B, Covid) Nasopharyngeal Swab     Status: None   Collection Time: 09/30/21  3:29 PM   Specimen: Nasopharyngeal Swab; Nasopharyngeal(NP) swabs in vial transport medium  Result Value Ref Range   SARS Coronavirus 2 by RT PCR NEGATIVE NEGATIVE    Comment: (NOTE) SARS-CoV-2 target nucleic acids are NOT DETECTED.  The SARS-CoV-2 RNA is generally detectable in upper respiratory specimens during the acute phase of infection. The lowest concentration of SARS-CoV-2 viral copies this assay can detect is 138 copies/mL. A negative result does not preclude SARS-Cov-2 infection and should not be used as the sole basis for treatment or other patient management decisions. A negative result may occur with  improper specimen collection/handling, submission of specimen other than nasopharyngeal swab, presence of viral mutation(s) within the areas targeted by this assay, and inadequate number of viral copies(<138 copies/mL). A negative result must be combined with clinical observations, patient history, and epidemiological information. The expected result is Negative.  Fact Sheet for Patients:  EntrepreneurPulse.com.au  Fact Sheet for Healthcare Providers:  IncredibleEmployment.be  This test is no t yet approved or cleared by the [place] FDA and  has been authorized for detection and/or diagnosis of SARS-CoV-2 by FDA under an Emergency Use Authorization (EUA). This EUA will remain  in effect (meaning this test can be used) for the duration of the COVID-19 declaration under Section 564(b)(1) of the Act, 21 U.S.C.section 360bbb-3(b)(1), unless the authorization is terminated  or revoked sooner.       Influenza A by PCR NEGATIVE NEGATIVE   Influenza B by PCR  NEGATIVE NEGATIVE    Comment: (NOTE) The Xpert Xpress SARS-CoV-2/FLU/RSV plus assay is intended as an aid in the diagnosis of influenza from Nasopharyngeal swab specimens and should not be used as a sole basis for treatment. Nasal washings and aspirates are unacceptable for Xpert Xpress SARS-CoV-2/FLU/RSV testing.  Fact Sheet for Patients: EntrepreneurPulse.com.au  Fact Sheet for Healthcare Providers: IncredibleEmployment.be  This test is not yet approved or cleared by the [place] FDA and has been authorized for detection and/or diagnosis of SARS-CoV-2 by FDA under an Emergency Use Authorization (EUA). This EUA will remain in effect (meaning this test  can be used) for the duration of the COVID-19 declaration under Section 564(b)(1) of the Act, 21 U.S.C. section 360bbb-3(b)(1), unless the authorization is terminated or revoked.  Performed at [facility], [address] [place], [place] [address]    No results found.  Pending Labs Unresulted Labs (From admission, onward)     Start     Ordered   09/30/21 1436  Iron and TIBC  ONCE - STAT,   STAT        09/30/21 1435   09/30/21 1436  Ferritin (Iron Binding Protein)  ONCE - STAT,   STAT        09/30/21 1435            Vitals/Pain Today's Vitals   09/30/21 1530 09/30/21 1600 09/30/21 1800 09/30/21 1908  BP: 108/62 109/68 100/77 127/70  Pulse: 87 89 87 85  Resp: 18 (!) 21 20 (!) 22  Temp:    98.3 F (36.8 C)  TempSrc:    Oral  SpO2: 100% 100% 100% 100%  Weight:      Height:      PainSc:    0-No pain    Isolation Precautions No active isolations  Medications Medications - No data to display  Mobility walks Low fall risk   Focused Assessments Cardiac Assessment Handoff:  Cardiac Rhythm: Normal sinus rhythm No results found for: CKTOTAL, CKMB, CKMBINDEX, TROPONINI No results found for: DDIMER Does the Patient currently have chest pain? No    , Pulmonary Assessment Handoff:  Lung sounds:   O2 Device: Room Air     ,    R Recommendations: See Admitting Provider Note  Report given to:   Additional Notes:    Pt states going to her yearly physical for routine blood work. Labs critical for hgb 5.0 and was advised for ED visit. Pt denies abdominal pain, no rectal bleeding, no dizziness, and feeling per her normal. States having heavy menstrual cycles. Currently day 2. Changed pad and tampon every hour, with few clots. S/s associated with feeling "a little short of breath." Pt 100% room air RR 22. Pt states being diagnosed with iron deficiency anemia 2 year ago. Was recently put back on iron supplements at Friday, that she has not picked up yet. Pt A&O, VS WDL.

## 2021-09-30 NOTE — H&P (Signed)
History and Physical   REGENE EAPEN [id number] DOB: [birth year] DOA: 09/30/2021  PCP: Shanon Rosser, PA-C  Patient coming from: Home  Chief Complaint: Abnormal labs  HPI: Whitney Newman is a 36 y.o. female with medical history significant of heavy menses, anemia presenting with abnormal labs.  Patient with known history of iron deficiency anemia and heavy menses presenting after CBC at PCP office showed hemoglobin to be 5.  Sent to the ED for further evaluation.  States she has long history of heavy menstrual bleeding currently on day 2 of her menstrual cycle saturating pads every 1-2 hours.  Denies any bloody or dark stools.   She states that she has had heavy menses for about 5 years.  She has seen OB/GYN in the past last time was around 2 years ago she states she is had an ultrasound before and there is some mention of a cyst but no mention of any fibroids.  She states that she had previously been started on iron due to a mildly low hemoglobin but this was discontinued after her iron level on repeat came back high.  She states that her labs at the [facility] office were 2 days ago prior to her starting her current menstrual period.  She denies fevers, chills, chest pain, shortness breath, abdominal, constipation, diarrhea, nausea, vomiting.  ED Course: Vital signs in the ED stable.  Lab work-up showed CMP with potassium of 3.4, glucose 136.  CBC with platelets 639 and hemoglobin of 5.4, previous hemoglobin a year ago was 11.  In the ED ferritin checked and was 1 and iron level was 15.  Review of Systems: As per HPI otherwise all other systems reviewed and are negative.  Past Medical History:  Diagnosis Date   Allergy     Past Surgical History:  Procedure Laterality Date   EYE MUSCLE SURGERY     x2    Social History  reports that she has been smoking. She has been smoking an average of .25 packs per day. She has never used smokeless tobacco. She reports current alcohol use of about  3.0 standard drinks per week. She reports that she does not use drugs.  No Known Allergies  Family History  Problem Relation Age of Onset   Hypertension Mother    Arthritis Mother    Sleep apnea Mother    Liver disease Mother    Hypertension Father    Diabetes Father    Kidney disease Father    Arthritis Father    Liver disease Sister    Sleep apnea Brother    Cancer Maternal Grandmother        cervical   Cancer Paternal Grandfather        throat   Sleep apnea Sister    Hypertension Sister    Diabetes Sister   Reviewed on admission  Prior to Admission medications   Medication Sig Start Date End Date Taking? Authorizing Provider  Iron-FA-B Cmp-C-Biot-Probiotic (FUSION PLUS) CAPS Take by mouth.   Yes [provider]  pregabalin (LYRICA) 150 MG capsule Take 150 mg by mouth 2 (two) times daily.   Yes [provider]  ondansetron (ZOFRAN) 4 MG tablet Take 1 tablet (4 mg total) by mouth every 8 (eight) hours as needed for nausea or vomiting. Patient not taking: Reported on 09/30/2021 07/31/20   Zigmund Gottron, NP    Physical Exam: Vitals:   09/30/21 1800 09/30/21 1908 09/30/21 2000 09/30/21 2043  BP: 100/77 127/70 124/72 121/69  Pulse: 87 85 82 74  Resp: 20 (!) 22 20 18   Temp:  98.3 F (36.8 C)  98.6 F (37 C)  TempSrc:  Oral  Oral  SpO2: 100% 100% 100% 100%  Weight:      Height:        Physical Exam Constitutional:      General: She is not in acute distress.    Appearance: Normal appearance.  HENT:     Head: Normocephalic and atraumatic.     Mouth/Throat:     Mouth: Mucous membranes are moist.     Pharynx: Oropharynx is clear.  Eyes:     Extraocular Movements: Extraocular movements intact.     Pupils: Pupils are equal, round, and reactive to light.  Cardiovascular:     Rate and Rhythm: Normal rate and regular rhythm.     Pulses: Normal pulses.     Heart sounds: Normal heart sounds.  Pulmonary:     Effort: Pulmonary effort is normal. No  respiratory distress.     Breath sounds: Normal breath sounds.  Abdominal:     General: Bowel sounds are normal. There is no distension.     Palpations: Abdomen is soft.     Tenderness: There is no abdominal tenderness.  Musculoskeletal:        General: No swelling or deformity.  Skin:    General: Skin is warm and dry.  Neurological:     General: No focal deficit present.     Mental Status: Mental status is at baseline.    Labs on Admission: I have personally reviewed following labs and imaging studies  CBC: Recent Labs  Lab 09/30/21 1339  WBC 6.4  HGB 5.4*  HCT 21.6*  MCV 58.9*  PLT 639*    Basic Metabolic Panel: Recent Labs  Lab 09/30/21 1339  NA 139  K 3.4*  CL 107  CO2 23  GLUCOSE 136*  BUN 11  CREATININE 0.92  CALCIUM 9.1    GFR: Estimated Creatinine Clearance: 115 mL/min (by C-G formula based on SCr of 0.92 mg/dL).  Liver Function Tests: Recent Labs  Lab 09/30/21 1339  AST 14*  ALT 9  ALKPHOS 59  BILITOT 0.3  PROT 7.7  ALBUMIN 4.1    Urine analysis:    Component Value Date/Time   COLORURINE YELLOW 02/11/2010 1706   APPEARANCEUR CLOUDY (A) 02/11/2010 1706   LABSPEC 1.030 12/20/2019 1041   PHURINE 7.0 02/11/2010 1706   GLUCOSEU NEGATIVE 02/11/2010 1706   HGBUR NEGATIVE 02/11/2010 1706   BILIRUBINUR negative 12/20/2019 1041   KETONESUR negative 12/20/2019 [place] 02/11/2010 1706   PROTEINUR trace (A) 12/20/2019 1041   PROTEINUR NEGATIVE 02/11/2010 1706   UROBILINOGEN 1.0 02/11/2010 1706   NITRITE Negative 12/20/2019 1041   NITRITE NEGATIVE 02/11/2010 1706   LEUKOCYTESUR Negative 12/20/2019 1041    Radiological Exams on Admission: No results found.  EKG: Not performed in the ED.  Assessment/Plan Principal Problem:   Anemia Active Problems:   Vaginal bleeding   Iron deficiency anemia   Hypokalemia   Anemia Iron deficiency Menorrhagia > Patient with known history of menorrhagia presenting with abnormal lab  from PCP office with hemoglobin of 5 per report there. > Hemoglobin in the ED noted to be 5.4.  Patient transferred to [facility] for blood transfusion and further evaluation. > Ferritin and iron checked at med center with ferritin of 1 and iron of 15. > Did have positive FOBT however this is possibly/likely contaminated consider she  is currently on her period.  Could consider GI consult if concern for rectal bleeding.  She does have some dark stools but no frankly bloody nor black stools. - Place patient on telemetry - Patient was typed and screened, transfused 2 units - Patient may benefit from iron transfusion as well while here - Consider OB/GYN consult and vaginal ultrasound, holding off for now as she has had heavy menses for 5 years and has seen OB/GYN in the interim.  - Consider GI consult versus repeat FOBT considering positive FOBT in the setting of active menstrual bleeding.  Hypokalemia > Mild hypokalemia with potassium of 3.4 in the ED. - 40 mEq p.o. potassium - Check magnesium level - Trend electrolytes  DVT prophylaxis: SCDs Code Status:   Full Family Communication:  None on admission Disposition Plan:   Patient is from:  Home  Anticipated DC to:  Home  Anticipated DC date:  1 to 2 days  Anticipated DC barriers: None  Consults called:  None, could consider consulting OB/GYN Admission status:  Observation, telemetry  Severity of Illness: The appropriate patient status for this patient is OBSERVATION. Observation status is judged to be reasonable and necessary in order to provide the required intensity of service to ensure the patient's safety. The patient's presenting symptoms, physical exam findings, and initial radiographic and laboratory data in the context of their medical condition is felt to place them at decreased risk for further clinical deterioration. Furthermore, it is anticipated that the patient will be medically stable for discharge from the hospital within 2  midnights of admission.    Marcelyn Bruins MD [facility]  How to contact the [facility] Attending or Consulting provider [place] or covering provider during after hours [place], for this patient?   Check the care team in [facility] and look for a) attending/consulting [facility] provider listed and b) the [facility] team listed Log into [email or web address] and use [place]'s universal password to access. If you do not have the password, please contact the hospital operator. Locate the [facility] provider you are looking for under [facility] and page to a number that you can be directly reached. If you still have difficulty reaching the provider, please page the [facility] (Director on Call) for the Hospitalists listed on amion for assistance.  09/30/2021, 9:17 PM

## 2021-09-30 NOTE — ED Triage Notes (Signed)
Patient arrives via POV due to having an abnormal hgb. Per pt, she had blood drawn at her pcp during her regular checkup and her hgb level was 5. Prior history of anemia diagnosis, but no history of needing blood.

## 2021-10-01 DIAGNOSIS — D509 Iron deficiency anemia, unspecified: Secondary | ICD-10-CM | POA: Diagnosis not present

## 2021-10-01 DIAGNOSIS — I9589 Other hypotension: Secondary | ICD-10-CM

## 2021-10-01 DIAGNOSIS — D649 Anemia, unspecified: Secondary | ICD-10-CM

## 2021-10-01 DIAGNOSIS — E861 Hypovolemia: Secondary | ICD-10-CM

## 2021-10-01 DIAGNOSIS — N939 Abnormal uterine and vaginal bleeding, unspecified: Secondary | ICD-10-CM | POA: Diagnosis not present

## 2021-10-01 DIAGNOSIS — E876 Hypokalemia: Secondary | ICD-10-CM | POA: Diagnosis not present

## 2021-10-01 LAB — BASIC METABOLIC PANEL
Anion gap: 7 (ref 5–15)
BUN: 8 mg/dL (ref 6–20)
CO2: 22 mmol/L (ref 22–32)
Calcium: 8.6 mg/dL — ABNORMAL LOW (ref 8.9–10.3)
Chloride: 108 mmol/L (ref 98–111)
Creatinine, Ser: 1.03 mg/dL — ABNORMAL HIGH (ref 0.44–1.00)
GFR, Estimated: >60 mL/min (ref >60)
Glucose, Bld: 109 mg/dL — ABNORMAL HIGH (ref 70–99)
Potassium: 3.8 mmol/L (ref 3.5–5.1)
Sodium: 137 mmol/L (ref 135–145)

## 2021-10-01 LAB — CBC
HCT: 24.9 % — ABNORMAL LOW (ref 36.0–46.0)
Hemoglobin: 7 g/dL — ABNORMAL LOW (ref 12.0–15.0)
MCH: 17.9 pg — ABNORMAL LOW (ref 26.0–34.0)
MCHC: 28.1 g/dL — ABNORMAL LOW (ref 30.0–36.0)
MCV: 63.8 fL — ABNORMAL LOW (ref 80.0–100.0)
Platelets: 528 10*3/uL — ABNORMAL HIGH (ref 150–400)
RBC: 3.9 MIL/uL (ref 3.87–5.11)
RDW: 24.7 % — ABNORMAL HIGH (ref 11.5–15.5)
WBC: 7 10*3/uL (ref 4.0–10.5)
nRBC: 0.3 % — ABNORMAL HIGH (ref 0.0–0.2)

## 2021-10-01 LAB — MAGNESIUM: Magnesium: 1.9 mg/dL (ref 1.7–2.4)

## 2021-10-01 LAB — PREPARE RBC (CROSSMATCH)

## 2021-10-01 MED ORDER — PANTOPRAZOLE SODIUM 40 MG PO TBEC
40.0000 mg | DELAYED_RELEASE_TABLET | Freq: Every day | ORAL | Status: DC
  Administered 2021-10-01: 40 mg via ORAL
  Filled 2021-10-01 (×2): qty 1

## 2021-10-01 MED ORDER — PEG-KCL-NACL-NASULF-NA ASC-C 100 G PO SOLR: 1.0000 | Freq: Once | ORAL | Status: DC

## 2021-10-01 MED ORDER — PEG-KCL-NACL-NASULF-NA ASC-C 100 G PO SOLR
0.5000 | Freq: Once | ORAL | Status: AC
Start: 2021-10-01 — End: 2021-10-01
  Administered 2021-10-01: 100 g via ORAL
  Filled 2021-10-01 (×3): qty 1

## 2021-10-01 MED ORDER — SODIUM CHLORIDE 0.9% IV SOLUTION: Freq: Once | INTRAVENOUS | Status: DC

## 2021-10-01 MED ORDER — ACETAMINOPHEN 325 MG PO TABS
650.0000 mg | ORAL_TABLET | Freq: Once | ORAL | Status: AC
  Administered 2021-10-01: 650 mg via ORAL
  Filled 2021-10-01: qty 2

## 2021-10-01 MED ORDER — PEG-KCL-NACL-NASULF-NA ASC-C 100 G PO SOLR
0.5000 | Freq: Once | ORAL | Status: AC
  Administered 2021-10-02: 100 g via ORAL

## 2021-10-01 NOTE — H&P (View-Only) (Signed)
Consultation  Referring Provider:   Dr. Beola Cord Primary Care Physician:  Lindaann Pascal, PA-C Primary Gastroenterologist:  Gentry Fitz       Reason for Consultation:   IDA   Impression    36 year old female with history of IDA and menorrhagia presents with hemoglobin of 5. Patient denies any overt GI bleeding Patient had positive fecal occult blood in the ER however possible contamination.  Iron deficiency anemia without overt GI bleeding , questionable FOBT positive HGB 7.0 MCV 63.8  WBC 7.0 Platelets 528 09/30/2021 Iron 15 Ferritin 1 B12 442 Patient states she did respond to oral iron previously 2 years ago, has not been on currently.  Constipation Could be contributing to FOBT positive stools if patient has hemorrhoids  Upper GI symptoms Denies reflux throat chest pain and previous episodes of nausea and vomiting could be related to gastritis in setting of NSAID use. No overt melena  Menorrhagia Continue GYN follow up and evaluation  Hypokalemia Replete with primary    Plan   -Discussed with patient fianc different options.   -We will discuss with Dr. Leonides Schanz regarding pursuing endoscopic evaluation with flex sigmoidoscopy and upper endoscopy, patient clearly has history of menorrhagia and IDA that responded to iron in the past. -Final decision per Dr. Leonides Schanz, continue supportive care - continue GYN eval --Continue to monitor H&H with transfusion as needed to maintain hemoglobin greater than 7. -Suggest IV iron transfusions this visit as well - PPI BID, avoid NSAIDS  Thank you for your kind consultation, we will continue to follow.         HPI:   Whitney Newman is a 36 y.o. female with past medical history significant for menorrhagia and known history of iron def anemia presents to ER from PCP with hemoglobin of 5.   Patient gives history and her fianc also in the room and she gives some of the history. Patient has longstanding history of IDA, was  on iron 2 years ago with presumed menorrhagia, improved with oral iron and was taken off, lost to follow-up. She is poor about following up with physicians, last known CBC was 2 years ago.  Patient states she has menorrhagia for years can have 15 to 28-day cycle, will last 5 to 7 days, heaviest time being first 1 to 2 days soaking a pad every 1-2 hours. Patient had menstrual cycle beginning of January and is on day 2 of current cycle.  Patient states she has a longstanding history of constipation, will have bowel movement every 2 to 3 days with straining can have rectal pain with straining but states she does not look at her stool or the toilet paper, has never seen hematochezia. Patient has had darker stool but denies true black stools over the last 2 years. Not on iron. Patient has had some upper GI symptoms though.  Having episodes of nausea and vomiting associated with food lasting several months, last episode 5 months ago.  Can have diffuse upper abdominal discomfort with nausea and vomiting worsened by food no radiation to her back. Patient states she was having left upper neck pain and sciatica pain, was taking up to 10-15 ibuprofen daily for 6 to 8 months, last time was 5 months ago, placed on Lyrica. Patient has had increasing dyspnea over the last 5 to 6 months that her fianc states she has noticed. Patient's also had nonexertional chest discomfort worse with lying in bed, not worse with food mentally for 5 months.  Patient denies dysphagia, odynophagia, reflux.  Patient ER had positive FOBT, possible contamination as patient denies hematochezia or melena.  Patient patient quit smoking 5 months ago, states has 1 alcoholic beverage once a month.. Family history of liver disease in mother, esophageal cancer in paternal grandfather, cervical cancer maternal grandmother.  Denies any other GI disease. Denies family history of gallbladder issues.  Past Medical History:  Diagnosis Date    Allergy     Surgical History:  She  has a past surgical history that includes Eye muscle surgery. Family History:  Her family history includes Arthritis in her father and mother; Cancer in her maternal grandmother and paternal grandfather; Diabetes in her father and sister; Hypertension in her father, mother, and sister; Kidney disease in her father; Liver disease in her mother and sister; Sleep apnea in her brother, mother, and sister. Social History:   reports that she has been smoking. She has been smoking an average of .25 packs per day. She has never used smokeless tobacco. She reports current alcohol use of about 3.0 standard drinks per week. She reports that she does not use drugs.  Prior to Admission medications   Medication Sig Start Date End Date Taking? Authorizing Provider  Iron-FA-B Cmp-C-Biot-Probiotic (FUSION PLUS) CAPS Take by mouth.   Yes [provider]  pregabalin (LYRICA) 150 MG capsule Take 150 mg by mouth 2 (two) times daily.   Yes [provider]  ondansetron (ZOFRAN) 4 MG tablet Take 1 tablet (4 mg total) by mouth every 8 (eight) hours as needed for nausea or vomiting. Patient not taking: Reported on 09/30/2021 07/31/20   Georgetta Haber, NP    Current Facility-Administered Medications  Medication Dose Route Frequency Provider Last Rate Last Admin   0.9 %  sodium chloride infusion (Manually program via Guardrails IV Fluids)   Intravenous Once Lynn Ito, MD       acetaminophen (TYLENOL) tablet 650 mg  650 mg Oral Q6H PRN Synetta Fail, MD       Or   acetaminophen (TYLENOL) suppository 650 mg  650 mg Rectal Q6H PRN Synetta Fail, MD       acetaminophen (TYLENOL) tablet 650 mg  650 mg Oral Once Lynn Ito, MD       polyethylene glycol (MIRALAX / GLYCOLAX) packet 17 g  17 g Oral Daily PRN Synetta Fail, MD       pregabalin (LYRICA) capsule 150 mg  150 mg Oral BID Synetta Fail, MD   150 mg at 10/01/21 1000   sodium chloride  flush (NS) 0.9 % injection 3 mL  3 mL Intravenous Q12H Synetta Fail, MD   3 mL at 09/30/21 2215    Allergies as of 09/30/2021   (No Known Allergies)    Review of Systems:    Constitutional: No weight loss, fever, chills, weakness or fatigue HEENT: Eyes: No change in vision               Ears, Nose, Throat:  No change in hearing or congestion Skin: No rash or itching Cardiovascular: No chest pain, chest pressure or palpitations   Respiratory: No SOB or cough Gastrointestinal: See HPI and otherwise negative Genitourinary: No dysuria or change in urinary frequency Neurological: No headache, dizziness or syncope Musculoskeletal: No new muscle or joint pain Hematologic: No bleeding or bruising Psychiatric: No history of depression or anxiety     Physical Exam:  Vital signs in last 24 hours: Temp:  [98.1 F (36.7  C)-99.1 °F (37.3 °C)] 98.2 °F (36.8 °C) (02/01 0930) °Pulse Rate:  [74-100] 77 (02/01 0930) °Resp:  [15-22] 17 (02/01 0930) °BP: (100-129)/(46-77) 128/66 (02/01 0930) °SpO2:  [96 %-100 %] 96 % (02/01 0930) °Weight:  [118.2 kg] 118.2 kg (01/31 1327) °Last BM Date: 09/29/21 ° °General:   Pleasant, obese AA female in no acute distress °Head:  Normocephalic and atraumatic. °Eyes: sclerae anicteric,conjunctive pale  °Heart:  regular rate and rhythm °Pulm: Clear anteriorly; no wheezing °Abdomen:  Soft, Obese AB, skin exam normal, Normal bowel sounds.  no  tenderness . Without guarding and Without rebound, without hepatomegaly. °Extremities:  Without edema. °Msk:  Symmetrical without gross deformities. Peripheral pulses intact.  °Neurologic:  Alert and  oriented x4;  grossly normal neurologically. °Skin:   Dry and intact without significant lesions or rashes. °Psychiatric: Demonstrates good judgement and reason without abnormal affect or behaviors. ° °LAB RESULTS: °Recent Labs  °  09/30/21 °1339 10/01/21 °0852  °WBC 6.4 7.0  °HGB 5.4* 7.0*  °HCT 21.6* 24.9*  °PLT 639* 528*   ° °BMET °Recent Labs  °  09/30/21 °1339 10/01/21 °0852  °NA 139 137  °K 3.4* 3.8  °CL 107 108  °CO2 23 22  °GLUCOSE 136* 109*  °BUN 11 8  °CREATININE 0.92 1.03*  °CALCIUM 9.1 8.6*  ° °LFT °Recent Labs  °  09/30/21 °1339  °PROT 7.7  °ALBUMIN 4.1  °AST 14*  °ALT 9  °ALKPHOS 59  °BILITOT 0.3  ° °PT/INR °No results for input(s): LABPROT, INR in the last 72 hours. ° °STUDIES: °No results found. ° ° °Aundre Hietala R Humbert Morozov  10/01/2021, 10:30 AM ° °

## 2021-10-01 NOTE — Plan of Care (Signed)
  Problem: Education: Goal: Knowledge of General Education information will improve Description Including pain rating scale, medication(s)/side effects and non-pharmacologic comfort measures Outcome: Progressing   Problem: Health Behavior/Discharge Planning: Goal: Ability to manage health-related needs will improve Outcome: Progressing   

## 2021-10-01 NOTE — Progress Notes (Signed)
PROGRESS NOTE    Whitney Newman [facility]  [id number] DOB: 19-Apr-1986 DOA: 09/30/2021 PCP: Whitney Rosser, PA-C    Brief Narrative:   Whitney Newman is a 36 y.o. female with medical history significant of heavy menses, anemia presenting with abnormal labs.  Patient with known history of iron deficiency anemia and heavy menses presenting after CBC at PCP office showed hemoglobin to be 5.  Sent to the ED for further evaluation.  States she has long history of heavy menstrual bleeding currently on day 2 of her menstrual cycle saturating pads every 1-2 hours.  Denies any bloody or dark stools.    She states that she has had heavy menses for about 5 years.  She has seen OB/GYN in the past last time was around 2 years ago she states she is had an ultrasound before and there is some mention of a cyst but no mention of any fibroids.  She states that she had previously been started on iron due to a mildly low hemoglobin but this was discontinued after her iron level on repeat came back high.   She states that her labs at the [facility] office were 2 days ago prior to her starting her current menstrual period.  2/1 status post 2 units packed red blood cell transfusion.  Post H&H is 7. Discussion with patient, she reported in the past her primary care did want to do GI work-up due to her anemia which was not as low as on presentation but she was too lazy to follow-up to get this done.  She reports her menstrual cycle is usually heavy the first day and then it slows down.  Overnight she has not changed her pad in yet.     Consultants:    Procedures:   Antimicrobials:      Subjective: Denies shortness of breath but still feels weak.  No other symptoms.  Objective: Vitals:   10/01/21 0345 10/01/21 0423 10/01/21 0636 10/01/21 0930  BP: (!) 101/46 (!) 115/59 107/60 128/66  Pulse: 84 83 78 77  Resp: 16 15 16 17   Temp: 98.7 F (37.1 C) 98.7 F (37.1 C) 98.1 F (36.7 C) 98.2 F (36.8 C)  TempSrc: Oral Oral  Oral   SpO2: 100% 100% 100% 96%  Weight:      Height:        Intake/Output Summary (Last 24 hours) at 10/01/2021 1032 Last data filed at 10/01/2021 0800 Gross per 24 hour  Intake 1175.83 ml  Output 0 ml  Net 1175.83 ml   Filed Weights   09/30/21 1327  Weight: 118.2 kg    Examination:  General exam: Appears calm and comfortable  Respiratory system: Clear to auscultation. Respiratory effort normal. Cardiovascular system: S1 & S2 heard, RRR. No JVD, murmurs, rubs, gallops or clicks.  Gastrointestinal system: Abdomen is nondistended, soft and nontender.  Normal bowel sounds heard. Central nervous system: Alert and oriented. No focal neurological deficits. Extremities: No edema Psychiatry: Judgement and insight appear normal. Mood & affect appropriate.     Data Reviewed: I have personally reviewed following labs and imaging studies  CBC: Recent Labs  Lab 09/30/21 1339 10/01/21 0852  WBC 6.4 7.0  HGB 5.4* 7.0*  HCT 21.6* 24.9*  MCV 58.9* 63.8*  PLT 639* 0000000*   Basic Metabolic Panel: Recent Labs  Lab 09/30/21 1339 10/01/21 0852  NA 139 137  K 3.4* 3.8  CL 107 108  CO2 23 22  GLUCOSE 136* 109*  BUN 11 8  CREATININE  0.92 1.03*  CALCIUM 9.1 8.6*  MG  --  1.9   GFR: Estimated Creatinine Clearance: 102.7 mL/min (A) (by C-G formula based on SCr of 1.03 mg/dL (H)). Liver Function Tests: Recent Labs  Lab 09/30/21 1339  AST 14*  ALT 9  ALKPHOS 59  BILITOT 0.3  PROT 7.7  ALBUMIN 4.1   No results for input(s): LIPASE, AMYLASE in the last 168 hours. No results for input(s): AMMONIA in the last 168 hours. Coagulation Profile: No results for input(s): INR, PROTIME in the last 168 hours. Cardiac Enzymes: No results for input(s): CKTOTAL, CKMB, CKMBINDEX, TROPONINI in the last 168 hours. BNP (last 3 results) No results for input(s): PROBNP in the last 8760 hours. HbA1C: No results for input(s): HGBA1C in the last 72 hours. CBG: No results for input(s): GLUCAP in  the last 168 hours. Lipid Profile: No results for input(s): CHOL, HDL, LDLCALC, TRIG, CHOLHDL, LDLDIRECT in the last 72 hours. Thyroid Function Tests: No results for input(s): TSH, T4TOTAL, FREET4, T3FREE, THYROIDAB in the last 72 hours. Anemia Panel: Recent Labs    09/30/21 1449  FERRITIN 1*  TIBC 396  IRON 15*   Sepsis Labs: No results for input(s): PROCALCITON, LATICACIDVEN in the last 168 hours.  Recent Results (from the past 240 hour(s))  Resp Panel by RT-PCR (Flu A&B, Covid) Nasopharyngeal Swab     Status: None   Collection Time: 09/30/21  3:29 PM   Specimen: Nasopharyngeal Swab; Nasopharyngeal(NP) swabs in vial transport medium  Result Value Ref Range Status   SARS Coronavirus 2 by RT PCR NEGATIVE NEGATIVE Final    Comment: (NOTE) SARS-CoV-2 target nucleic acids are NOT DETECTED.  The SARS-CoV-2 RNA is generally detectable in upper respiratory specimens during the acute phase of infection. The lowest concentration of SARS-CoV-2 viral copies this assay can detect is 138 copies/mL. A negative result does not preclude SARS-Cov-2 infection and should not be used as the sole basis for treatment or other patient management decisions. A negative result may occur with  improper specimen collection/handling, submission of specimen other than nasopharyngeal swab, presence of viral mutation(s) within the areas targeted by this assay, and inadequate number of viral copies(<138 copies/mL). A negative result must be combined with clinical observations, patient history, and epidemiological information. The expected result is Negative.  Fact Sheet for Patients:  EntrepreneurPulse.com.au  Fact Sheet for Healthcare Providers:  IncredibleEmployment.be  This test is no t yet approved or cleared by the [place] FDA and  has been authorized for detection and/or diagnosis of SARS-CoV-2 by FDA under an Emergency Use Authorization (EUA). This EUA  will remain  in effect (meaning this test can be used) for the duration of the COVID-19 declaration under Section 564(b)(1) of the Act, 21 U.S.C.section 360bbb-3(b)(1), unless the authorization is terminated  or revoked sooner.       Influenza A by PCR NEGATIVE NEGATIVE Final   Influenza B by PCR NEGATIVE NEGATIVE Final    Comment: (NOTE) The Xpert Xpress SARS-CoV-2/FLU/RSV plus assay is intended as an aid in the diagnosis of influenza from Nasopharyngeal swab specimens and should not be used as a sole basis for treatment. Nasal washings and aspirates are unacceptable for Xpert Xpress SARS-CoV-2/FLU/RSV testing.  Fact Sheet for Patients: EntrepreneurPulse.com.au  Fact Sheet for Healthcare Providers: IncredibleEmployment.be  This test is not yet approved or cleared by the [place] FDA and has been authorized for detection and/or diagnosis of SARS-CoV-2 by FDA under an Emergency Use Authorization (EUA). This EUA will remain  in effect (meaning this test can be used) for the duration of the COVID-19 declaration under Section 564(b)(1) of the Act, 21 U.S.C. section 360bbb-3(b)(1), unless the authorization is terminated or revoked.  Performed at [facility], [address], [place], [place] [address]          Radiology Studies: No results found.      Scheduled Meds:  sodium chloride   Intravenous Once   acetaminophen  650 mg Oral Once   pregabalin  150 mg Oral BID   sodium chloride flush  3 mL Intravenous Q12H   Continuous Infusions:  Assessment & Plan:   Principal Problem:   Anemia Active Problems:   Vaginal bleeding   Iron deficiency anemia   Hypokalemia   Anemia Iron deficiency Menorrhagia > Patient with known history of menorrhagia presenting with abnormal lab from PCP office with hemoglobin of 5 per report there. 2/1 FOB +. Was suppose to have outpatient GI w/u for anemia in the past per  pt, but did not f/u with MD. Will consult GI to r/o GIB Her menorrhagia is usually first day and reports has not had to change her padding overnight yet. Has made an appointment with OB/GYN for next Friday. Status post 2 units packed red blood cells H Hgb 7.0.  Will transfuse 1 more unit of packed red blood cells this a.m.    Hypokalemia Replaced Stable  Hypotension Likely from anemia/blood loss as above This am improved Continue to monitor   DVT prophylaxis: SCD Code Status: Full Family Communication: Family at bedside Disposition Plan:  Status is: Observation The patient remains OBS appropriate and will d/c before 2 midnights.  Planned Discharge Destination: Home             LOS: 0 days   Time spent: 35 minutes with more than 50% on [place], MD [facility] Pager [phone]  If 7PM-7AM, please contact night-coverage 10/01/2021, 10:32 AM

## 2021-10-01 NOTE — Consult Note (Signed)
Consultation  Referring Provider:   Dr. Beola Cord Primary Care Physician:  Lindaann Pascal, PA-C Primary Gastroenterologist:  Gentry Fitz       Reason for Consultation:   IDA   Impression    36 year old female with history of IDA and menorrhagia presents with hemoglobin of 5. Patient denies any overt GI bleeding Patient had positive fecal occult blood in the ER however possible contamination.  Iron deficiency anemia without overt GI bleeding , questionable FOBT positive HGB 7.0 MCV 63.8  WBC 7.0 Platelets 528 09/30/2021 Iron 15 Ferritin 1 B12 442 Patient states she did respond to oral iron previously 2 years ago, has not been on currently.  Constipation Could be contributing to FOBT positive stools if patient has hemorrhoids  Upper GI symptoms Denies reflux throat chest pain and previous episodes of nausea and vomiting could be related to gastritis in setting of NSAID use. No overt melena  Menorrhagia Continue GYN follow up and evaluation  Hypokalemia Replete with primary    Plan   -Discussed with patient fianc different options.   -We will discuss with Dr. Leonides Schanz regarding pursuing endoscopic evaluation with flex sigmoidoscopy and upper endoscopy, patient clearly has history of menorrhagia and IDA that responded to iron in the past. -Final decision per Dr. Leonides Schanz, continue supportive care - continue GYN eval --Continue to monitor H&H with transfusion as needed to maintain hemoglobin greater than 7. -Suggest IV iron transfusions this visit as well - PPI BID, avoid NSAIDS  Thank you for your kind consultation, we will continue to follow.         HPI:   Whitney Newman is a 36 y.o. female with past medical history significant for menorrhagia and known history of iron def anemia presents to ER from PCP with hemoglobin of 5.   Patient gives history and her fianc also in the room and she gives some of the history. Patient has longstanding history of IDA, was  on iron 2 years ago with presumed menorrhagia, improved with oral iron and was taken off, lost to follow-up. She is poor about following up with physicians, last known CBC was 2 years ago.  Patient states she has menorrhagia for years can have 15 to 28-day cycle, will last 5 to 7 days, heaviest time being first 1 to 2 days soaking a pad every 1-2 hours. Patient had menstrual cycle beginning of January and is on day 2 of current cycle.  Patient states she has a longstanding history of constipation, will have bowel movement every 2 to 3 days with straining can have rectal pain with straining but states she does not look at her stool or the toilet paper, has never seen hematochezia. Patient has had darker stool but denies true black stools over the last 2 years. Not on iron. Patient has had some upper GI symptoms though.  Having episodes of nausea and vomiting associated with food lasting several months, last episode 5 months ago.  Can have diffuse upper abdominal discomfort with nausea and vomiting worsened by food no radiation to her back. Patient states she was having left upper neck pain and sciatica pain, was taking up to 10-15 ibuprofen daily for 6 to 8 months, last time was 5 months ago, placed on Lyrica. Patient has had increasing dyspnea over the last 5 to 6 months that her fianc states she has noticed. Patient's also had nonexertional chest discomfort worse with lying in bed, not worse with food mentally for 5 months.  Patient denies dysphagia, odynophagia, reflux.  Patient ER had positive FOBT, possible contamination as patient denies hematochezia or melena.  Patient patient quit smoking 5 months ago, states has 1 alcoholic beverage once a month.. Family history of liver disease in mother, esophageal cancer in paternal grandfather, cervical cancer maternal grandmother.  Denies any other GI disease. Denies family history of gallbladder issues.  Past Medical History:  Diagnosis Date    Allergy     Surgical History:  She  has a past surgical history that includes Eye muscle surgery. Family History:  Her family history includes Arthritis in her father and mother; Cancer in her maternal grandmother and paternal grandfather; Diabetes in her father and sister; Hypertension in her father, mother, and sister; Kidney disease in her father; Liver disease in her mother and sister; Sleep apnea in her brother, mother, and sister. Social History:   reports that she has been smoking. She has been smoking an average of .25 packs per day. She has never used smokeless tobacco. She reports current alcohol use of about 3.0 standard drinks per week. She reports that she does not use drugs.  Prior to Admission medications   Medication Sig Start Date End Date Taking? Authorizing Provider  Iron-FA-B Cmp-C-Biot-Probiotic (FUSION PLUS) CAPS Take by mouth.   Yes [provider]  pregabalin (LYRICA) 150 MG capsule Take 150 mg by mouth 2 (two) times daily.   Yes [provider]  ondansetron (ZOFRAN) 4 MG tablet Take 1 tablet (4 mg total) by mouth every 8 (eight) hours as needed for nausea or vomiting. Patient not taking: Reported on 09/30/2021 07/31/20   Georgetta Haber, NP    Current Facility-Administered Medications  Medication Dose Route Frequency Provider Last Rate Last Admin   0.9 %  sodium chloride infusion (Manually program via Guardrails IV Fluids)   Intravenous Once Lynn Ito, MD       acetaminophen (TYLENOL) tablet 650 mg  650 mg Oral Q6H PRN Synetta Fail, MD       Or   acetaminophen (TYLENOL) suppository 650 mg  650 mg Rectal Q6H PRN Synetta Fail, MD       acetaminophen (TYLENOL) tablet 650 mg  650 mg Oral Once Lynn Ito, MD       polyethylene glycol (MIRALAX / GLYCOLAX) packet 17 g  17 g Oral Daily PRN Synetta Fail, MD       pregabalin (LYRICA) capsule 150 mg  150 mg Oral BID Synetta Fail, MD   150 mg at 10/01/21 1000   sodium chloride  flush (NS) 0.9 % injection 3 mL  3 mL Intravenous Q12H Synetta Fail, MD   3 mL at 09/30/21 2215    Allergies as of 09/30/2021   (No Known Allergies)    Review of Systems:    Constitutional: No weight loss, fever, chills, weakness or fatigue HEENT: Eyes: No change in vision               Ears, Nose, Throat:  No change in hearing or congestion Skin: No rash or itching Cardiovascular: No chest pain, chest pressure or palpitations   Respiratory: No SOB or cough Gastrointestinal: See HPI and otherwise negative Genitourinary: No dysuria or change in urinary frequency Neurological: No headache, dizziness or syncope Musculoskeletal: No new muscle or joint pain Hematologic: No bleeding or bruising Psychiatric: No history of depression or anxiety     Physical Exam:  Vital signs in last 24 hours: Temp:  [98.1 F (36.7  C)-99.1 F (37.3 C)] 98.2 F (36.8 C) (02/01 0930) Pulse Rate:  [74-100] 77 (02/01 0930) Resp:  [15-22] 17 (02/01 0930) BP: (100-129)/(46-77) 128/66 (02/01 0930) SpO2:  [96 %-100 %] 96 % (02/01 0930) Weight:  [161.0[118.2 kg] 118.2 kg (01/31 1327) Last BM Date: 09/29/21  General:   Pleasant, obese AA female in no acute distress Head:  Normocephalic and atraumatic. Eyes: sclerae anicteric,conjunctive pale  Heart:  regular rate and rhythm Pulm: Clear anteriorly; no wheezing Abdomen:  Soft, Obese AB, skin exam normal, Normal bowel sounds.  no  tenderness . Without guarding and Without rebound, without hepatomegaly. Extremities:  Without edema. Msk:  Symmetrical without gross deformities. Peripheral pulses intact.  Neurologic:  Alert and  oriented x4;  grossly normal neurologically. Skin:   Dry and intact without significant lesions or rashes. Psychiatric: Demonstrates good judgement and reason without abnormal affect or behaviors.  LAB RESULTS: Recent Labs    09/30/21 1339 10/01/21 0852  WBC 6.4 7.0  HGB 5.4* 7.0*  HCT 21.6* 24.9*  PLT 639* 528*    BMET Recent Labs    09/30/21 1339 10/01/21 0852  NA 139 137  K 3.4* 3.8  CL 107 108  CO2 23 22  GLUCOSE 136* 109*  BUN 11 8  CREATININE 0.92 1.03*  CALCIUM 9.1 8.6*   LFT Recent Labs    09/30/21 1339  PROT 7.7  ALBUMIN 4.1  AST 14*  ALT 9  ALKPHOS 59  BILITOT 0.3   PT/INR No results for input(s): LABPROT, INR in the last 72 hours.  STUDIES: No results found.   Doree Albeemanda R Carel Schnee  10/01/2021, 10:30 AM

## 2021-10-01 NOTE — Progress Notes (Signed)
°  Transition of Care [facility]) Screening Note   Patient Details  Name: Whitney Newman Date of Birth: 04-13-1986   Transition of Care [facility]) CM/SW Contact:    Tom-Johnson, Hershal Coria, RN Phone Number: 10/01/2021, 3:16 PM  Patient is from home with significant other. Admitted for Anemia. States she has heavy menses. 2 units PRBC given for hgb at 5.4, now 7.0.Currently employed at [employer].Independent with care and drive self prior to hospitalization. Denies any TOC needs. 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 Transition of Care Department [facility]) has reviewed patient and no recommendations noted at this time. TOC will continue to monitor patient advancement through interdisciplinary progression rounds. If new patient transition needs arise, please place a TOC consult.

## 2021-10-02 ENCOUNTER — Encounter (HOSPITAL_COMMUNITY)
Admission: EM | Disposition: A | Discharge status: Home / Self Care | Payer: Self-pay | Source: Home / Self Care | Attending: Emergency Medicine

## 2021-10-02 ENCOUNTER — Observation Stay (HOSPITAL_COMMUNITY): Payer: Managed Care, Other (non HMO) | Admitting: Certified Registered"

## 2021-10-02 ENCOUNTER — Encounter (HOSPITAL_COMMUNITY): Payer: Self-pay | Admitting: Internal Medicine

## 2021-10-02 DIAGNOSIS — D509 Iron deficiency anemia, unspecified: Secondary | ICD-10-CM | POA: Diagnosis not present

## 2021-10-02 DIAGNOSIS — E876 Hypokalemia: Secondary | ICD-10-CM | POA: Diagnosis not present

## 2021-10-02 DIAGNOSIS — D649 Anemia, unspecified: Secondary | ICD-10-CM | POA: Diagnosis not present

## 2021-10-02 DIAGNOSIS — K3189 Other diseases of stomach and duodenum: Secondary | ICD-10-CM | POA: Diagnosis not present

## 2021-10-02 DIAGNOSIS — N939 Abnormal uterine and vaginal bleeding, unspecified: Secondary | ICD-10-CM | POA: Diagnosis not present

## 2021-10-02 HISTORY — PX: BIOPSY: SHX5522

## 2021-10-02 HISTORY — PX: COLONOSCOPY WITH PROPOFOL: SHX5780

## 2021-10-02 HISTORY — PX: ESOPHAGOGASTRODUODENOSCOPY: SHX5428

## 2021-10-02 LAB — BPAM RBC
Blood Product Expiration Date: 202302022359
Blood Product Expiration Date: 202302252359
Blood Product Expiration Date: 202303012359
ISSUE DATE / TIME: 202302010011
ISSUE DATE / TIME: 202302010354
ISSUE DATE / TIME: 202302011303
Unit Type and Rh: 5100
Unit Type and Rh: 5100
Unit Type and Rh: 5100

## 2021-10-02 LAB — TYPE AND SCREEN
ABO/RH(D): O POS
Antibody Screen: NEGATIVE
Unit division: 0
Unit division: 0
Unit division: 0

## 2021-10-02 LAB — CBC
HCT: 28 % — ABNORMAL LOW (ref 36.0–46.0)
Hemoglobin: 8.1 g/dL — ABNORMAL LOW (ref 12.0–15.0)
MCH: 19.1 pg — ABNORMAL LOW (ref 26.0–34.0)
MCHC: 28.9 g/dL — ABNORMAL LOW (ref 30.0–36.0)
MCV: 66.2 fL — ABNORMAL LOW (ref 80.0–100.0)
Platelets: 526 10*3/uL — ABNORMAL HIGH (ref 150–400)
RBC: 4.23 MIL/uL (ref 3.87–5.11)
RDW: 27.1 % — ABNORMAL HIGH (ref 11.5–15.5)
WBC: 7.5 10*3/uL (ref 4.0–10.5)
nRBC: 0.4 % — ABNORMAL HIGH (ref 0.0–0.2)

## 2021-10-02 SURGERY — EGD (ESOPHAGOGASTRODUODENOSCOPY)
Anesthesia: Monitor Anesthesia Care

## 2021-10-02 MED ORDER — SODIUM CHLORIDE 0.9 % IV SOLN: INTRAVENOUS | Status: DC

## 2021-10-02 MED ORDER — MIDAZOLAM HCL (PF) 5 MG/ML IJ SOLN
INTRAMUSCULAR | Status: AC
  Filled 2021-10-02: qty 1

## 2021-10-02 MED ORDER — LACTATED RINGERS IV SOLN: INTRAVENOUS | Status: DC

## 2021-10-02 MED ORDER — MIDAZOLAM HCL 2 MG/2ML IJ SOLN
INTRAMUSCULAR | Status: DC | PRN
  Administered 2021-10-02: 2 mg via INTRAVENOUS

## 2021-10-02 MED ORDER — ONDANSETRON HCL 4 MG/2ML IJ SOLN: 4.0000 mg | Freq: Once | INTRAMUSCULAR | Status: DC | PRN

## 2021-10-02 MED ORDER — LIDOCAINE 2% (20 MG/ML) 5 ML SYRINGE
INTRAMUSCULAR | Status: DC | PRN
  Administered 2021-10-02: 20 mg via INTRAVENOUS

## 2021-10-02 MED ORDER — PROPOFOL 500 MG/50ML IV EMUL
INTRAVENOUS | Status: DC | PRN
  Administered 2021-10-02: 125 ug/kg/min via INTRAVENOUS

## 2021-10-02 MED ORDER — AMISULPRIDE (ANTIEMETIC) 5 MG/2ML IV SOLN
10.0000 mg | Freq: Once | INTRAVENOUS | Status: DC | PRN
  Filled 2021-10-02: qty 4

## 2021-10-02 SURGICAL SUPPLY — 22 items

## 2021-10-02 NOTE — Op Note (Signed)
[facility] Patient Name: Whitney Newman Procedure Date : 10/02/2021 MRN: [id number] Attending MD: Georgian Co ,  Date of Birth: 23-Mar-1986 CSN: [id number] Age: 36 Admit Type: Inpatient Procedure:                Colonoscopy Indications:              Iron deficiency anemia Providers:                Adline Mango" Marlynn Perking, RN, Kary Kos RN, RN Referring MD:             Hospitalist team Medicines:                Monitored Anesthesia Care Complications:            No immediate complications. Estimated Blood Loss:     Estimated blood loss was minimal. Procedure:                Pre-Anesthesia Assessment:                           - Prior to the procedure, a History and Physical                            was performed, and patient medications and                            allergies were reviewed. The patient's tolerance of                            previous anesthesia was also reviewed. The risks                            and benefits of the procedure and the sedation                            options and risks were discussed with the patient.                            All questions were answered, and informed consent                            was obtained. Prior Anticoagulants: The patient has                            taken no previous anticoagulant or antiplatelet                            agents. ASA Grade Assessment: II - A patient with                            mild systemic disease. After reviewing the risks  and benefits, the patient was deemed in                            satisfactory condition to undergo the procedure.                           After obtaining informed consent, the colonoscope                            was passed under direct vision. Throughout the                            procedure, the patient's blood pressure, pulse, and                            oxygen  saturations were monitored continuously. The                            [id number]) Olympus colonoscope was                            introduced through the anus and advanced to the the                            cecum, identified by appendiceal orifice and                            ileocecal valve. The colonoscopy was performed                            without difficulty. The patient tolerated the                            procedure well. The quality of the bowel                            preparation was fair. The ileocecal valve,                            appendiceal orifice, and rectum were photographed. Scope In: 12:40:31 PM Scope Out: 1:01:19 PM Scope Withdrawal Time: 0 hours 13 minutes 52 seconds  Total Procedure Duration: 0 hours 20 minutes 48 seconds  Findings:      Non-bleeding internal hemorrhoids were found during retroflexion.      Semi-liquid stool was found in the entire colon.      The exam was otherwise without abnormality. Impression:               - Preparation of the colon was fair.                           - Non-bleeding internal hemorrhoids.                           - Semi-liquid stool in the entire examined colon.                           -  The examination was otherwise normal.                           - No specimens collected. Recommendation:           - Return patient to hospital ward for ongoing care.                           - No obvious source for GI blood loss was found. I                            suspect that the patient's iron deficiency anemia                            is due to menorrhagia.                           - Continue iron supplementation.                           - Patient should start routine colon cancer                            screening at age 38.                           - The findings and recommendations were discussed                            with the patient. Procedure Code(s):        --- Professional ---                            [phone], Colonoscopy, flexible; diagnostic, including                            collection of specimen(s) by brushing or washing,                            when performed (separate procedure) Diagnosis Code(s):        --- Professional ---                           K64.8, Other hemorrhoids                           D50.9, Iron deficiency anemia, unspecified CPT copyright 2019 American Medical Association. All rights reserved. The codes documented in this report are preliminary and upon coder review may  be revised to meet current compliance requirements. Sonny Masters "Christia Reading,  10/02/2021 1:12:32 PM Number of Addenda: 0

## 2021-10-02 NOTE — Progress Notes (Signed)
DISCHARGE NOTE HOME Whitney Newman [facility] to be discharged Home per MD order. Discussed prescriptions and follow up appointments with the patient. Prescriptions given to patient; medication list explained in detail. Patient verbalized understanding.  Skin clean, dry and intact without evidence of skin break down, no evidence of skin tears noted. IV catheter discontinued intact. Site without signs and symptoms of complications. Dressing and pressure applied. Pt denies pain at the site currently. No complaints noted.  Patient free of lines, drains, and wounds.   An After Visit Summary (AVS) was printed and given to the patient. Patient escorted via wheelchair, and discharged home via private auto.  [name], RN

## 2021-10-02 NOTE — Discharge Summary (Signed)
Whitney Newman YPP:[id number] DOB: [birth year] DOA: 09/30/2021  PCP: Whitney Pascal, PA-C  Admit date: 09/30/2021 Discharge date: 10/02/2021  Admitted From: Home Disposition: Home  Recommendations for Outpatient Follow-up:  Follow up with PCP in 1 week Please obtain BMP/CBC in one week Follow-up with GYN next week as previously scheduled       Discharge Condition:Stable CODE STATUS:full  Diet recommendation: Heart Healthy  Brief/Interim Summary: Per WPY:KDXIPJ N Youngren is a 36 y.o. female with medical history significant of heavy menses, anemia presenting with abnormal labs.  Patient with known history of iron deficiency anemia and heavy menses presenting after CBC at PCP office showed hemoglobin to be 5.  Sent to the ED for further evaluation.  States she has Newman history of heavy menstrual bleeding currently on day 2 of her menstrual cycle saturating pads every 1-2 hours.  Denies any bloody or dark stools.  Anemia Iron deficiency Menorrhagia S/p total 3 units PRBC transfusion Hemoglobin this a.m. stable She underwent EGD and colonoscopy which was unremarkable Likely anemia due to menorrhagia.  She does have an appointment with her GYN next week       Hypokalemia Replaced Stable   Hypotension Likely from anemia/blood loss as above Improved    Discharge Diagnoses:  Principal Problem:   Anemia Active Problems:   Vaginal bleeding   Iron deficiency anemia   Hypokalemia    Discharge Instructions  Discharge Instructions     Call MD for:  persistant dizziness or light-headedness   Complete by: As directed    Diet - low sodium heart healthy   Complete by: As directed    Discharge instructions   Complete by: As directed    Make sure follow up with your GYN next week as scheduled F/u with pcp for labs   Increase activity slowly   Complete by: As directed       Allergies as of 10/02/2021   No Known Allergies      Medication List     STOP taking these  medications    ondansetron 4 MG tablet Commonly known as: Zofran       TAKE these medications    Fusion Plus Caps Take by mouth.   pregabalin 150 MG capsule Commonly known as: LYRICA Take 150 mg by mouth 2 (two) times daily.        Follow-up Information     Newman, Scott, PA-C Follow up in 1 week(s).   Specialty: Physician Assistant Contact information: [address] [place] [address] [phone]                No Known Allergies  Consultations: GI   Procedures/Studies: No results found.    Subjective: No shortness of breath or chest pain or dizziness  Discharge Exam: Vitals:   10/02/21 1310 10/02/21 1325  BP: (!) 111/54 (!) 111/97  Pulse: 91 80  Resp: (!) 24 19  Temp: 97.8 F (36.6 C) 97.8 F (36.6 C)  SpO2: 100% 100%   Vitals:   10/02/21 0854 10/02/21 1153 10/02/21 1310 10/02/21 1325  BP: 120/71 (!) 148/84 (!) 111/54 (!) 111/97  Pulse: 73 70 91 80  Resp: 17 18 (!) 24 19  Temp: 98.3 F (36.8 C) (!) 97.4 F (36.3 C) 97.8 F (36.6 C) 97.8 F (36.6 C)  TempSrc:  Temporal    SpO2: 100% 100% 100% 100%  Weight:  118.2 kg    Height:  5\' 9"  (1.753 m)      General: Pt is  alert, awake, not in acute distress Cardiovascular: RRR, S1/S2 +, no rubs, no gallops Respiratory: CTA bilaterally, no wheezing, no rhonchi Abdominal: Soft, NT, ND, bowel sounds + Extremities: no edema, no cyanosis    The results of significant diagnostics from this hospitalization (including imaging, microbiology, ancillary and laboratory) are listed below for reference.     Microbiology: Recent Results (from the past 240 hour(s))  Resp Panel by RT-PCR (Flu A&B, Covid) Nasopharyngeal Swab     Status: None   Collection Time: 09/30/21  3:29 PM   Specimen: Nasopharyngeal Swab; Nasopharyngeal(NP) swabs in vial transport medium  Result Value Ref Range Status   SARS Coronavirus 2 by RT PCR NEGATIVE NEGATIVE Final    Comment: (NOTE) SARS-CoV-2 target  nucleic acids are NOT DETECTED.  The SARS-CoV-2 RNA is generally detectable in upper respiratory specimens during the acute phase of infection. The lowest concentration of SARS-CoV-2 viral copies this assay can detect is 138 copies/mL. A negative result does not preclude SARS-Cov-2 infection and should not be used as the sole basis for treatment or other patient management decisions. A negative result may occur with  improper specimen collection/handling, submission of specimen other than nasopharyngeal swab, presence of viral mutation(s) within the areas targeted by this assay, and inadequate number of viral copies(<138 copies/mL). A negative result must be combined with clinical observations, patient history, and epidemiological information. The expected result is Negative.  Fact Sheet for Patients:  BloggerCourse.comhttps://www.fda.gov/media/152166/download  Fact Sheet for Healthcare Providers:  SeriousBroker.ithttps://www.fda.gov/media/152162/download  This test is no t yet approved or cleared by the [place] FDA and  has been authorized for detection and/or diagnosis of SARS-CoV-2 by FDA under an Emergency Use Authorization (EUA). This EUA will remain  in effect (meaning this test can be used) for the duration of the COVID-19 declaration under Section 564(b)(1) of the Act, 21 U.S.C.section 360bbb-3(b)(1), unless the authorization is terminated  or revoked sooner.       Influenza A by PCR NEGATIVE NEGATIVE Final   Influenza B by PCR NEGATIVE NEGATIVE Final    Comment: (NOTE) The Xpert Xpress SARS-CoV-2/FLU/RSV plus assay is intended as an aid in the diagnosis of influenza from Nasopharyngeal swab specimens and should not be used as a sole basis for treatment. Nasal washings and aspirates are unacceptable for Xpert Xpress SARS-CoV-2/FLU/RSV testing.  Fact Sheet for Patients: BloggerCourse.comhttps://www.fda.gov/media/152166/download  Fact Sheet for Healthcare  Providers: SeriousBroker.ithttps://www.fda.gov/media/152162/download  This test is not yet approved or cleared by the [place] FDA and has been authorized for detection and/or diagnosis of SARS-CoV-2 by FDA under an Emergency Use Authorization (EUA). This EUA will remain in effect (meaning this test can be used) for the duration of the COVID-19 declaration under Section 564(b)(1) of the Act, 21 U.S.C. section 360bbb-3(b)(1), unless the authorization is terminated or revoked.  Performed at [facility], [address], [place], [place] [id number]      Labs: BNP (last 3 results) No results for input(s): BNP in the last 8760 hours. Basic Metabolic Panel: Recent Labs  Lab 09/30/21 1339 10/01/21 0852  NA 139 137  K 3.4* 3.8  CL 107 108  CO2 23 22  GLUCOSE 136* 109*  BUN 11 8  CREATININE 0.92 1.03*  CALCIUM 9.1 8.6*  MG  --  1.9   Liver Function Tests: Recent Labs  Lab 09/30/21 1339  AST 14*  ALT 9  ALKPHOS 59  BILITOT 0.3  PROT 7.7  ALBUMIN 4.1   No results for input(s): LIPASE, AMYLASE in the  last 168 hours. No results for input(s): AMMONIA in the last 168 hours. CBC: Recent Labs  Lab 09/30/21 1339 10/01/21 0852 10/02/21 0100  WBC 6.4 7.0 7.5  HGB 5.4* 7.0* 8.1*  HCT 21.6* 24.9* 28.0*  MCV 58.9* 63.8* 66.2*  PLT 639* 528* 526*   Cardiac Enzymes: No results for input(s): CKTOTAL, CKMB, CKMBINDEX, TROPONINI in the last 168 hours. BNP: Invalid input(s): POCBNP CBG: No results for input(s): GLUCAP in the last 168 hours. D-Dimer No results for input(s): DDIMER in the last 72 hours. Hgb A1c No results for input(s): HGBA1C in the last 72 hours. Lipid Profile No results for input(s): CHOL, HDL, LDLCALC, TRIG, CHOLHDL, LDLDIRECT in the last 72 hours. Thyroid function studies No results for input(s): TSH, T4TOTAL, T3FREE, THYROIDAB in the last 72 hours.  Invalid input(s): FREET3 Anemia work up Recent Labs    09/30/21 1449  FERRITIN 1*  TIBC  396  IRON 15*   Urinalysis    Component Value Date/Time   COLORURINE YELLOW 02/11/2010 1706   APPEARANCEUR CLOUDY (A) 02/11/2010 1706   LABSPEC 1.030 12/20/2019 1041   PHURINE 7.0 02/11/2010 1706   GLUCOSEU NEGATIVE 02/11/2010 1706   HGBUR NEGATIVE 02/11/2010 1706   BILIRUBINUR negative 12/20/2019 1041   KETONESUR negative 12/20/2019 1041   KETONESUR NEGATIVE 02/11/2010 1706   PROTEINUR trace (A) 12/20/2019 1041   PROTEINUR NEGATIVE 02/11/2010 1706   UROBILINOGEN 1.0 02/11/2010 1706   NITRITE Negative 12/20/2019 1041   NITRITE NEGATIVE 02/11/2010 1706   LEUKOCYTESUR Negative 12/20/2019 1041   Sepsis Labs Invalid input(s): PROCALCITONIN,  WBC,  LACTICIDVEN Microbiology Recent Results (from the past 240 hour(s))  Resp Panel by RT-PCR (Flu A&B, Covid) Nasopharyngeal Swab     Status: None   Collection Time: 09/30/21  3:29 PM   Specimen: Nasopharyngeal Swab; Nasopharyngeal(NP) swabs in vial transport medium  Result Value Ref Range Status   SARS Coronavirus 2 by RT PCR NEGATIVE NEGATIVE Final    Comment: (NOTE) SARS-CoV-2 target nucleic acids are NOT DETECTED.  The SARS-CoV-2 RNA is generally detectable in upper respiratory specimens during the acute phase of infection. The lowest concentration of SARS-CoV-2 viral copies this assay can detect is 138 copies/mL. A negative result does not preclude SARS-Cov-2 infection and should not be used as the sole basis for treatment or other patient management decisions. A negative result may occur with  improper specimen collection/handling, submission of specimen other than nasopharyngeal swab, presence of viral mutation(s) within the areas targeted by this assay, and inadequate number of viral copies(<138 copies/mL). A negative result must be combined with clinical observations, patient history, and epidemiological information. The expected result is Negative.  Fact Sheet for Patients:   BloggerCourse.comhttps://www.fda.gov/media/152166/download  Fact Sheet for Healthcare Providers:  SeriousBroker.ithttps://www.fda.gov/media/152162/download  This test is no t yet approved or cleared by the [place] FDA and  has been authorized for detection and/or diagnosis of SARS-CoV-2 by FDA under an Emergency Use Authorization (EUA). This EUA will remain  in effect (meaning this test can be used) for the duration of the COVID-19 declaration under Section 564(b)(1) of the Act, 21 U.S.C.section 360bbb-3(b)(1), unless the authorization is terminated  or revoked sooner.       Influenza A by PCR NEGATIVE NEGATIVE Final   Influenza B by PCR NEGATIVE NEGATIVE Final    Comment: (NOTE) The Xpert Xpress SARS-CoV-2/FLU/RSV plus assay is intended as an aid in the diagnosis of influenza from Nasopharyngeal swab specimens and should not be used as a sole basis for treatment.  Nasal washings and aspirates are unacceptable for Xpert Xpress SARS-CoV-2/FLU/RSV testing.  Fact Sheet for Patients: BloggerCourse.com  Fact Sheet for Healthcare Providers: SeriousBroker.it  This test is not yet approved or cleared by the [place] FDA and has been authorized for detection and/or diagnosis of SARS-CoV-2 by FDA under an Emergency Use Authorization (EUA). This EUA will remain in effect (meaning this test can be used) for the duration of the COVID-19 declaration under Section 564(b)(1) of the Act, 21 U.S.C. section 360bbb-3(b)(1), unless the authorization is terminated or revoked.  Performed at [facility], [address], [place], [place] [address]      Time coordinating discharge: Over 30 minutes  SIGNED:   Lynn Ito, MD  [facility] 10/02/2021, 1:34 PM Pager   If 7PM-7AM, please contact night-coverage [email or web address] Password [email or web address]

## 2021-10-02 NOTE — Progress Notes (Signed)
Patient will be leaving as soon as patient is fully awake from her procedure.

## 2021-10-02 NOTE — Interval H&P Note (Signed)
History and Physical Interval Note:  10/02/2021 11:49 AM  [name]  has presented today for surgery, with the diagnosis of IDA.  The various methods of treatment have been discussed with the patient and family. After consideration of risks, benefits and other options for treatment, the patient has consented to  Procedure(s): ESOPHAGOGASTRODUODENOSCOPY (EGD) (N/A) COLONOSCOPY WITH PROPOFOL (N/A) as a surgical intervention.  The patient's history has been reviewed, patient examined, no change in status, stable for surgery.  I have reviewed the patient's chart and labs.  Questions were answered to the patient's satisfaction.     Whitney Newman

## 2021-10-02 NOTE — Transfer of Care (Signed)
Immediate Anesthesia Transfer of Care Note  Patient: Whitney Newman  Procedure(s) Performed: ESOPHAGOGASTRODUODENOSCOPY (EGD) COLONOSCOPY WITH PROPOFOL BIOPSY  Patient Location: PACU  Anesthesia Type:MAC  Level of Consciousness: alert  and sedated  Airway & Oxygen Therapy: Patient connected to nasal cannula oxygen  Post-op Assessment: Post -op Vital signs reviewed and stable  Post vital signs: stable  Last Vitals:  Vitals Value Taken Time  BP    Temp    Pulse    Resp    SpO2      Last Pain:  Vitals:   10/02/21 1153  TempSrc: Temporal  PainSc: 4          Complications: No notable events documented.

## 2021-10-02 NOTE — Anesthesia Postprocedure Evaluation (Signed)
Anesthesia Post Note  Patient: KANDACE ELROD  Procedure(s) Performed: ESOPHAGOGASTRODUODENOSCOPY (EGD) COLONOSCOPY WITH PROPOFOL BIOPSY     Patient location during evaluation: Endoscopy Anesthesia Type: MAC Level of consciousness: awake and alert Pain management: pain level controlled Vital Signs Assessment: post-procedure vital signs reviewed and stable Respiratory status: spontaneous breathing, nonlabored ventilation and respiratory function stable Cardiovascular status: stable and blood pressure returned to baseline Postop Assessment: no apparent nausea or vomiting Anesthetic complications: no   No notable events documented.  Last Vitals:  Vitals:   10/02/21 1325 10/02/21 1346  BP: (!) 111/97 127/80  Pulse: 80 71  Resp: 19 16  Temp: 36.6 C (!) 36.4 C  SpO2: 100% 100%    Last Pain:  Vitals:   10/02/21 1346  TempSrc: Oral  PainSc:                  Leinani Lisbon,W. EDMOND

## 2021-10-02 NOTE — Anesthesia Preprocedure Evaluation (Signed)
Anesthesia Evaluation  Patient identified by MRN, date of birth, ID band Patient awake    Reviewed: Allergy & Precautions, NPO status , Patient's Chart, lab work & pertinent test results  Airway Mallampati: II  TM Distance: >3 FB Neck ROM: Full    Dental  (+) Teeth Intact, Dental Advisory Given,    Pulmonary Current Smoker,    breath sounds clear to auscultation       Cardiovascular negative cardio ROS   Rhythm:Regular Rate:Normal     Neuro/Psych  Neuromuscular disease    GI/Hepatic negative GI ROS, Neg liver ROS,   Endo/Other  negative endocrine ROS  Renal/GU negative Renal ROS     Musculoskeletal   Abdominal Normal abdominal exam  (+)   Peds  Hematology negative hematology ROS (+)   Anesthesia Other Findings   Reproductive/Obstetrics                             Anesthesia Physical Anesthesia Plan  ASA: 2  Anesthesia Plan: MAC   Post-op Pain Management:    Induction: Intravenous  PONV Risk Score and Plan: 0 and Propofol infusion  Airway Management Planned: Natural Airway and Simple Face Mask  Additional Equipment: None  Intra-op Plan:   Post-operative Plan:   Informed Consent: I have reviewed the patients History and Physical, chart, labs and discussed the procedure including the risks, benefits and alternatives for the proposed anesthesia with the patient or authorized representative who has indicated his/her understanding and acceptance.     Dental advisory given  Plan Discussed with: CRNA  Anesthesia Plan Comments:         Anesthesia Quick Evaluation

## 2021-10-02 NOTE — Anesthesia Procedure Notes (Signed)
Procedure Name: MAC Date/Time: 10/02/2021 12:58 PM Performed by: Lavell Luster, CRNA Pre-anesthesia Checklist: Patient identified, Emergency Drugs available, Suction available, Patient being monitored and Timeout performed Patient Re-evaluated:Patient Re-evaluated prior to induction Oxygen Delivery Method: Nasal cannula Preoxygenation: Pre-oxygenation with 100% oxygen Induction Type: IV induction Placement Confirmation: breath sounds checked- equal and bilateral and positive ETCO2

## 2021-10-02 NOTE — Op Note (Signed)
[facility] Patient Name: Whitney Newman Procedure Date : 10/02/2021 MRN: [id number] Attending MD: Particia Lather ,  Date of Birth: [birth year] CSN: [id number] Age: 36 Admit Type: Inpatient Procedure:                Upper GI endoscopy Indications:              Iron deficiency anemia Providers:                Madelyn Brunner" Geoffry Paradise, RN, Estella Husk RN, RN Referring MD:              Medicines:                Monitored Anesthesia Care Complications:            No immediate complications. Estimated Blood Loss:     Estimated blood loss was minimal. Procedure:                Pre-Anesthesia Assessment:                           - Prior to the procedure, a History and Physical                            was performed, and patient medications and                            allergies were reviewed. The patient's tolerance of                            previous anesthesia was also reviewed. The risks                            and benefits of the procedure and the sedation                            options and risks were discussed with the patient.                            All questions were answered, and informed consent                            was obtained. Prior Anticoagulants: The patient has                            taken no previous anticoagulant or antiplatelet                            agents. ASA Grade Assessment: III - A patient with                            severe systemic disease. After reviewing the risks  and benefits, the patient was deemed in                            satisfactory condition to undergo the procedure.                           After obtaining informed consent, the endoscope was                            passed under direct vision. Throughout the                            procedure, the patient's blood pressure, pulse, and                            oxygen saturations  were monitored continuously. The                            [id number]-[id number] ([id number] Olympus endoscope was introduced                            through the mouth, and advanced to the second part                            of duodenum. The upper GI endoscopy was                            accomplished without difficulty. The patient                            tolerated the procedure well. Scope In: Scope Out: Findings:      The examined esophagus was normal.      Localized mildly erythematous mucosa without bleeding was found in the       gastric antrum. Biopsies were taken with a cold forceps for histology.      The examined duodenum was normal. Biopsies for histology were taken with       a cold forceps for evaluation of celiac disease. Impression:               - Normal esophagus.                           - Erythematous mucosa in the antrum. Biopsied.                           - Normal examined duodenum. Biopsied. Recommendation:           - Await pathology results.                           - Perform a colonoscopy today. Procedure Code(s):        --- Professional ---                           [id number], Esophagogastroduodenoscopy, flexible,  transoral; with biopsy, single or multiple Diagnosis Code(s):        --- Professional ---                           K31.89, Other diseases of stomach and duodenum                           D50.9, Iron deficiency anemia, unspecified CPT copyright 2019 American Medical Association. All rights reserved. The codes documented in this report are preliminary and upon coder review may  be revised to meet current compliance requirements. [address]Eulah Pont,  10/02/2021 12:37:55 PM Number of Addenda: 0

## 2021-10-06 ENCOUNTER — Encounter (HOSPITAL_COMMUNITY): Payer: Self-pay | Admitting: Internal Medicine

## 2021-10-06 LAB — SURGICAL PATHOLOGY

## 2021-10-10 ENCOUNTER — Other Ambulatory Visit: Payer: Self-pay

## 2021-10-10 DIAGNOSIS — A048 Other specified bacterial intestinal infections: Secondary | ICD-10-CM

## 2021-10-10 MED ORDER — PANTOPRAZOLE SODIUM 40 MG PO TBEC: 40.0000 mg | DELAYED_RELEASE_TABLET | Freq: Two times a day (BID) | ORAL | 0 refills | Status: DC

## 2021-10-10 MED ORDER — TETRACYCLINE HCL 500 MG PO CAPS: 500.0000 mg | ORAL_CAPSULE | Freq: Four times a day (QID) | ORAL | 0 refills | Status: AC

## 2021-10-10 MED ORDER — BISMUTH SUBSALICYLATE 262 MG PO CHEW: 524.0000 mg | CHEWABLE_TABLET | Freq: Four times a day (QID) | ORAL | 0 refills | Status: AC

## 2021-10-10 MED ORDER — METRONIDAZOLE 250 MG PO TABS: 250.0000 mg | ORAL_TABLET | Freq: Four times a day (QID) | ORAL | 0 refills | Status: AC

## 2021-10-31 ENCOUNTER — Other Ambulatory Visit: Payer: Managed Care, Other (non HMO)

## 2021-10-31 ENCOUNTER — Encounter: Payer: Self-pay | Admitting: Internal Medicine

## 2021-10-31 ENCOUNTER — Ambulatory Visit: Payer: Managed Care, Other (non HMO) | Admitting: Internal Medicine

## 2021-10-31 VITALS — BP 132/80 | HR 76 | Ht 69.0 in | Wt 265.8 lb

## 2021-10-31 DIAGNOSIS — D509 Iron deficiency anemia, unspecified: Secondary | ICD-10-CM | POA: Diagnosis not present

## 2021-10-31 DIAGNOSIS — A048 Other specified bacterial intestinal infections: Secondary | ICD-10-CM | POA: Diagnosis not present

## 2021-10-31 MED ORDER — SENNA 8.6 MG PO TABS
1.0000 | ORAL_TABLET | Freq: Every day | ORAL | 0 refills | Status: AC
Start: 2021-10-31 — End: ?

## 2021-10-31 NOTE — Patient Instructions (Signed)
Start Senna daily while on H. Pylori treatment.  ? ?Restart Pepto Bismol for 4 days.  ? ?Stool sample 4 weeks after completing H. Pylori treatment.  ? ?Your provider has requested that you go to the basement level for lab work before leaving today. Press "B" on the elevator. The lab is located at the first door on the left as you exit the elevator. ? ?If you are age 38 or younger, your body mass index should be between 19-25. Your Body mass index is 39.25 kg/m?Marland Kitchen If this is out of the aformentioned range listed, please consider follow up with your Primary Care Provider.  ? ?________________________________________________________ ? ?The [facility] providers would like to encourage you to use [facility] to communicate with providers for non-urgent requests or questions.  Due to long hold times on the telephone, sending your provider a message by [facility] may be a faster and more efficient way to get a response.  Please allow 48 business hours for a response.  Please remember that this is for non-urgent requests.  ?_______________________________________________________ ? ?

## 2021-10-31 NOTE — Progress Notes (Signed)
? ?Chief Complaint: H pylori, IDA ? ?HPI : 36 year old female with history of IDA, H pylori, and menorrhagia presents for follow up of H pylori and IDA. ? ?Patient presents with her girlfriend to her clinic visit today.  She has been on her H. pylori therapy for about 10 days.  She has been tolerating her medication regimen well for the most part.  She did stop taking her bismuth because she was experiencing constipation issues while taking the medication.  She has been taking all other components of the H. pylori therapy.  Denies blood in stools, ab pain, N&V. Denies lightheadedness, SOB, chest pain.  She has been working with her OB/GYN to try and make her menstrual periods less heavy.  She is currently taking daily iron supplementation. ? ? ?Current Outpatient Medications  ?Medication Sig Dispense Refill  ? Iron-FA-B Cmp-C-Biot-Probiotic (FUSION PLUS) CAPS Take by mouth.    ? metroNIDAZOLE (FLAGYL) 250 MG tablet Take 250 mg by mouth 4 (four) times daily.    ? pantoprazole (PROTONIX) 40 MG tablet Take 1 tablet (40 mg total) by mouth 2 (two) times daily for 14 days. 28 tablet 0  ? pregabalin (LYRICA) 150 MG capsule Take 150 mg by mouth 2 (two) times daily.    ? ?No current facility-administered medications for this visit.  ? ?Review of Systems: ?All systems reviewed and negative except where noted in HPI.  ? ?Physical Exam: ?BP 132/80   Pulse 76   Ht 5\' 9"  (1.753 m)   Wt 265 lb 12.8 oz (120.6 kg)   BMI 39.25 kg/m?  ?Constitutional: Pleasant,well-developed, female in no acute distress. ?HEENT: Normocephalic and atraumatic. Conjunctivae are normal. No scleral icterus. ?Cardiovascular: Normal rate, regular rhythm.  ?Pulmonary/chest: Effort normal and breath sounds normal. No wheezing, rales or rhonchi. ?Abdominal: Soft, nondistended, nontender. Bowel sounds active throughout. There are no masses palpable. No hepatomegaly. ?Extremities: No edema ?Neurological: Alert and oriented to person place and time. ?Skin:  Skin is warm and dry. No rashes noted. ?Psychiatric: Normal mood and affect. Behavior is normal. ? ?EGD 10/02/21: ?- Normal esophagus. ?- Erythematous mucosa in the antrum. Biopsied. ?- Normal examined duodenum. Biopsied. ?Path: ?A. DUODENUM, BIOPSY:  ?- Duodenal mucosa with no specific histopathologic changes  ?- Negative for increased intraepithelial lymphocytes or villous  ?architectural changes  ?B. STOMACH, BIOPSY:  ?- Gastric antral and oxyntic mucosa with chronic gastritis  ?- Immunohistochemical stain for Helicobacter pylori is positive.  ? ?Colonoscopy 10/02/21: ?- Preparation of the colon was fair. ?- Non-bleeding internal hemorrhoids. ?- Semi-liquid stool in the entire examined colon. ?- The examination was otherwise normal. ? ?ASSESSMENT AND PLAN: ?IDA ?H pylori ?Patient presents for follow-up after recent hospitalization for IDA.  At that time, she had EGD and colonoscopy performed that show any signs of active bleeding.  However, on stomach biopsies she was noted to have H. pylori infection.  Her H. pylori infection could be contributing to her issues with iron deficiency anemia.  She is currently on treatment for H. pylori.  We will have her restart her Pepto-Bismol and have her take a daily laxative to help with constipation issues.  She will have her H. pylori antigen checked in 1 month after she completes her H. pylori therapy ?- Start senna QD ?- Restart Pepto Bismol for 4 days ?- Cont iron supplementation ?- Plan for H pylori stool antigen in 1 month after completing H. pylori therapy ?- Patient had her CBC checked earlier this week with her OB/GYN ? ?  Eulah Pont, MD ? ?

## 2021-12-11 ENCOUNTER — Encounter: Payer: Self-pay | Admitting: Internal Medicine

## 2022-08-22 ENCOUNTER — Telehealth: Payer: Self-pay | Admitting: Urgent Care

## 2022-08-22 DIAGNOSIS — K047 Periapical abscess without sinus: Secondary | ICD-10-CM

## 2022-08-22 MED ORDER — NAPROXEN 500 MG PO TABS: 500.0000 mg | ORAL_TABLET | Freq: Two times a day (BID) | ORAL | 0 refills | Status: AC

## 2022-08-22 MED ORDER — AMOXICILLIN-POT CLAVULANATE 875-125 MG PO TABS: 1.0000 | ORAL_TABLET | Freq: Two times a day (BID) | ORAL | 0 refills | Status: AC

## 2022-08-22 NOTE — Progress Notes (Signed)
E-Visit for Dental Pain  We are sorry that you are not feeling well.  Here is how we plan to help!  Based on what you have shared with me in the questionnaire, it sounds like you have a dental infection.  Augmentin 875-125mg  twice a day for 7 days and Naprosyn 500mg  2 times a day for 7 days for discomfort  It is imperative that you see a dentist within 10 days of this eVisit to determine the cause of the dental pain and be sure it is adequately treated  A toothache or tooth pain is caused when the nerve in the root of a tooth or surrounding a tooth is irritated. Dental (tooth) infection, decay, injury, or loss of a tooth are the most common causes of dental pain. Pain may also occur after an extraction (tooth is pulled out). Pain sometimes originates from other areas and radiates to the jaw, thus appearing to be tooth pain.Bacteria growing inside your mouth can contribute to gum disease and dental decay, both of which can cause pain. A toothache occurs from inflammation of the central portion of the tooth called pulp. The pulp contains nerve endings that are very sensitive to pain. Inflammation to the pulp or pulpitis may be caused by dental cavities, trauma, and infection.    HOME CARE:   For toothaches: Over-the-counter pain medications such as acetaminophen or ibuprofen may be used. Take these as directed on the package while you arrange for a dental appointment. Avoid very cold or hot foods, because they may make the pain worse. You may get relief from biting on a cotton ball soaked in oil of cloves. You can get oil of cloves at most drug stores.  For jaw pain:  Aspirin may be helpful for problems in the joint of the jaw in adults. If pain happens every time you open your mouth widely, the temporomandibular joint (TMJ) may be the source of the pain. Yawning or taking a large bite of food may worsen the pain. An appointment with your doctor or dentist will help you find the cause.      GET HELP RIGHT AWAY IF:  You have a high fever or chills If you have had a recent head or face injury and develop headache, light headedness, nausea, vomiting, or other symptoms that concern you after an injury to your face or mouth, you could have a more serious injury in addition to your dental injury. A facial rash associated with a toothache: This condition may improve with medication. Contact your doctor for them to decide what is appropriate. Any jaw pain occurring with chest pain: Although jaw pain is most commonly caused by dental disease, it is sometimes referred pain from other areas. People with heart disease, especially people who have had stents placed, people with diabetes, or those who have had heart surgery may have jaw pain as a symptom of heart attack or angina. If your jaw or tooth pain is associated with lightheadedness, sweating, or shortness of breath, you should see a doctor as soon as possible. Trouble swallowing or excessive pain or bleeding from gums: If you have a history of a weakened immune system, diabetes, or steroid use, you may be more susceptible to infections. Infections can often be more severe and extensive or caused by unusual organisms. Dental and gum infections in people with these conditions may require more aggressive treatment. An abscess may need draining or IV antibiotics, for example.  MAKE SURE YOU   Understand these instructions.  Will watch your condition. Will get help right away if you are not doing well or get worse.  Thank you for choosing an e-visit.  Your e-visit answers were reviewed by a board certified advanced clinical practitioner to complete your personal care plan. Depending upon the condition, your plan could have included both over the counter or prescription medications.  Please review your pharmacy choice. Make sure the pharmacy is open so you can pick up prescription now. If there is a problem, you may contact your provider  through [employer] and have the prescription routed to another pharmacy.  Your safety is important to [place]. If you have drug allergies check your prescription carefully.   For the next 24 hours you can use MyChart to ask questions about today's visit, request a non-urgent call back, or ask for a work or school excuse. You will get an email in the next two days asking about your experience. I hope that your e-visit has been valuable and will speed your recovery.   I have spent 5 minutes in review of e-visit questionnaire, review and updating patient chart, medical decision making and response to patient.   Taijon Vink L Gustaf Mccarter, PA

## 2022-12-05 ENCOUNTER — Telehealth: Payer: Medicaid Other | Admitting: Nurse Practitioner

## 2022-12-05 DIAGNOSIS — M545 Low back pain, unspecified: Secondary | ICD-10-CM

## 2022-12-05 MED ORDER — CYCLOBENZAPRINE HCL 10 MG PO TABS: 10.0000 mg | ORAL_TABLET | Freq: Three times a day (TID) | ORAL | 0 refills | Status: DC | PRN

## 2022-12-05 MED ORDER — IBUPROFEN 600 MG PO TABS: 600.0000 mg | ORAL_TABLET | Freq: Three times a day (TID) | ORAL | 0 refills | Status: DC | PRN

## 2022-12-05 NOTE — Progress Notes (Signed)

## 2022-12-05 NOTE — Progress Notes (Signed)
I have spent 5 minutes in review of e-visit questionnaire, review and updating patient chart, medical decision making and response to patient.  ° °Arla Boutwell W Hassie Mandt, NP ° °  °

## 2022-12-27 ENCOUNTER — Telehealth: Payer: Medicaid Other | Admitting: Urgent Care

## 2022-12-27 DIAGNOSIS — K047 Periapical abscess without sinus: Secondary | ICD-10-CM

## 2022-12-27 MED ORDER — CLINDAMYCIN HCL 300 MG PO CAPS: 300.0000 mg | ORAL_CAPSULE | Freq: Three times a day (TID) | ORAL | 0 refills | Status: AC

## 2022-12-27 MED ORDER — NAPROXEN 500 MG PO TABS: 500.0000 mg | ORAL_TABLET | Freq: Two times a day (BID) | ORAL | 0 refills | Status: AC

## 2022-12-27 NOTE — Progress Notes (Signed)
E-Visit for Dental Pain  We are sorry that you are not feeling well.  Here is how we plan to help!  Based on what you have shared with me in the questionnaire, it sounds like you have a dental infection.  Clindamycin 300mg  3 times a day for 7 days and Naprosyn 500mg  2 times a day for 7 days for discomfort  It is imperative that you see a dentist within 10 days of this eVisit to determine the cause of the dental pain and be sure it is adequately treated  A toothache or tooth pain is caused when the nerve in the root of a tooth or surrounding a tooth is irritated. Dental (tooth) infection, decay, injury, or loss of a tooth are the most common causes of dental pain. Pain may also occur after an extraction (tooth is pulled out). Pain sometimes originates from other areas and radiates to the jaw, thus appearing to be tooth pain.Bacteria growing inside your mouth can contribute to gum disease and dental decay, both of which can cause pain. A toothache occurs from inflammation of the central portion of the tooth called pulp. The pulp contains nerve endings that are very sensitive to pain. Inflammation to the pulp or pulpitis may be caused by dental cavities, trauma, and infection.    HOME CARE:   For toothaches: Over-the-counter pain medications such as acetaminophen or ibuprofen may be used. Take these as directed on the package while you arrange for a dental appointment. Avoid very cold or hot foods, because they may make the pain worse. You may get relief from biting on a cotton ball soaked in oil of cloves. You can get oil of cloves at most drug stores.  For jaw pain:  Aspirin may be helpful for problems in the joint of the jaw in adults. If pain happens every time you open your mouth widely, the temporomandibular joint (TMJ) may be the source of the pain. Yawning or taking a large bite of food may worsen the pain. An appointment with your doctor or dentist will help you find the cause.      GET HELP RIGHT AWAY IF:  You have a high fever or chills If you have had a recent head or face injury and develop headache, light headedness, nausea, vomiting, or other symptoms that concern you after an injury to your face or mouth, you could have a more serious injury in addition to your dental injury. A facial rash associated with a toothache: This condition may improve with medication. Contact your doctor for them to decide what is appropriate. Any jaw pain occurring with chest pain: Although jaw pain is most commonly caused by dental disease, it is sometimes referred pain from other areas. People with heart disease, especially people who have had stents placed, people with diabetes, or those who have had heart surgery may have jaw pain as a symptom of heart attack or angina. If your jaw or tooth pain is associated with lightheadedness, sweating, or shortness of breath, you should see a doctor as soon as possible. Trouble swallowing or excessive pain or bleeding from gums: If you have a history of a weakened immune system, diabetes, or steroid use, you may be more susceptible to infections. Infections can often be more severe and extensive or caused by unusual organisms. Dental and gum infections in people with these conditions may require more aggressive treatment. An abscess may need draining or IV antibiotics, for example.  MAKE SURE YOU   Understand these  instructions. Will watch your condition. Will get help right away if you are not doing well or get worse.  Thank you for choosing an e-visit.  Your e-visit answers were reviewed by a board certified advanced clinical practitioner to complete your personal care plan. Depending upon the condition, your plan could have included both over the counter or prescription medications.  Please review your pharmacy choice. Make sure the pharmacy is open so you can pick up prescription now. If there is a problem, you may contact your provider  through [employer] and have the prescription routed to another pharmacy.  Your safety is important to [place]. If you have drug allergies check your prescription carefully.   For the next 24 hours you can use MyChart to ask questions about today's visit, request a non-urgent call back, or ask for a work or school excuse. You will get an email in the next two days asking about your experience. I hope that your e-visit has been valuable and will speed your recovery.   I have spent 5 minutes in review of e-visit questionnaire, review and updating patient chart, medical decision making and response to patient.   Mehtab Dolberry L Myles Tavella, PA

## 2023-01-18 ENCOUNTER — Encounter: Payer: Self-pay | Admitting: Obstetrics and Gynecology

## 2023-01-18 ENCOUNTER — Other Ambulatory Visit (HOSPITAL_COMMUNITY)
Admission: RE | Admit: 2023-01-18 | Discharge: 2023-01-18 | Disposition: A | Discharge status: Home / Self Care | Payer: Medicaid Other | Source: Ambulatory Visit | Attending: Obstetrics and Gynecology | Admitting: Obstetrics and Gynecology

## 2023-01-18 ENCOUNTER — Ambulatory Visit: Payer: Medicaid Other | Admitting: Obstetrics and Gynecology

## 2023-01-18 VITALS — BP 135/88 | HR 88 | Ht 69.0 in | Wt 289.0 lb

## 2023-01-18 DIAGNOSIS — Z1339 Encounter for screening examination for other mental health and behavioral disorders: Secondary | ICD-10-CM

## 2023-01-18 DIAGNOSIS — Z01419 Encounter for gynecological examination (general) (routine) without abnormal findings: Secondary | ICD-10-CM

## 2023-01-18 DIAGNOSIS — D259 Leiomyoma of uterus, unspecified: Secondary | ICD-10-CM

## 2023-01-18 MED ORDER — TRANEXAMIC ACID 650 MG PO TABS: 1300.0000 mg | ORAL_TABLET | Freq: Three times a day (TID) | ORAL | 2 refills | Status: AC

## 2023-01-18 NOTE — Progress Notes (Signed)
Subjective:     Whitney Newman is a 37 y.o. female P0 with LMP 01/14/23 with BMI 41 here for a routine exam.  Current complaints: AUB.  Patient reports heavy vaginal bleeding with passage of clots. She was being followed for this issue a few years ago with plans for a hysterectomy. Patient returns today for continued evaluation. She describes vaginal bleeding which can come once a month for 7 days or several weeks. She has been treated for anemia as a result. Patient is sexual active in a same sex relationship. She denies pelvic pain or abnormal discharge. She reports some lower back pain.    Gynecologic History Patient's last menstrual period was 01/14/2023. Contraception: same sex relationship Last Pap: 2020. Results were: normal Last mammogram: 12/2022. Results were: normal  Obstetric History OB History  Gravida Para Term Preterm AB Living  0 0 0 0 0 0  SAB IAB Ectopic Multiple Live Births  0 0 0 0 0   Past Medical History:  Diagnosis Date   Allergy    Past Surgical History:  Procedure Laterality Date   BIOPSY  10/02/2021   Procedure: BIOPSY;  Surgeon: Imogene Burn, MD;  Location[facility] ENDOSCOPY;  Service: Gastroenterology;;   COLONOSCOPY WITH PROPOFOL N/A 10/02/2021   Procedure: COLONOSCOPY WITH PROPOFOL;  Surgeon: Imogene Burn, MD;  Location: [facility] ENDOSCOPY;  Service: Gastroenterology;  Laterality: N/A;   ESOPHAGOGASTRODUODENOSCOPY N/A 10/02/2021   Procedure: ESOPHAGOGASTRODUODENOSCOPY (EGD);  Surgeon: Imogene Burn, MD;  Location: [facility] ENDOSCOPY;  Service: Gastroenterology;  Laterality: N/A;   EYE MUSCLE SURGERY     x2   Family History  Problem Relation Age of Onset   Hypertension Mother    Arthritis Mother    Sleep apnea Mother    Liver disease Mother    Hypertension Father    Diabetes Father    Kidney disease Father    Arthritis Father    Liver disease Sister    Sleep apnea Brother    Cancer Maternal Grandmother        cervical   Cancer Paternal Grandfather        throat    Sleep apnea Sister    Hypertension Sister    Diabetes Sister    Social History   Tobacco Use   Smoking status: Some Days    Packs/day: .25    Types: Cigarettes   Smokeless tobacco: Never   Tobacco comments:    Uses nicotine pouches  Vaping Use   Vaping Use: Never used  Substance Use Topics   Alcohol use: Yes    Alcohol/week: 3.0 standard drinks of alcohol    Types: 3 Shots of liquor per week    Comment: social   Drug use: No        Review of Systems Pertinent items noted in HPI and remainder of comprehensive ROS otherwise negative.    Objective:  Blood pressure 135/88, pulse 88, height 5\' 9"  (1.753 m), weight 289 lb (131.1 kg), last menstrual period 01/14/2023.   GENERAL: Well-developed, well-nourished female in no acute distress.  HEENT: Normocephalic, atraumatic. Sclerae anicteric.  NECK: Supple. Normal thyroid.  LUNGS: Clear to auscultation bilaterally.  HEART: Regular rate and rhythm. BREASTS: Symmetric in size. No palpable masses or lymphadenopathy, skin changes, or nipple drainage. ABDOMEN: Soft, nontender, nondistended. No organomegaly. PELVIC: Normal external female genitalia. Vagina is pink and rugated.  Normal discharge. Normal appearing cervix. Uterus size difficult to assess due to body habitus and may be retroverted. No adnexal mass  or tenderness. Chaperone present during the pelvic exam EXTREMITIES: No cyanosis, clubbing, or edema, 2+ distal pulses.     Assessment:    Healthy female exam.    Plan:    Pap smear collected STI screening per patient request Health maintenance labs ordered Patient will be contacted with abnormal results Pelvic ultrasound ordered to evaluate fibroid uterus Rx lysteda provided to assist with AUB Patient has a hard time tolerating exam, will differ endometrial biopsy after ultrasound

## 2023-01-18 NOTE — Progress Notes (Signed)
Pt in office for routine GYN exam, pt states does fibroids. Pt states she has heavy cycles with cramping.

## 2023-01-19 LAB — LIPID PANEL
Chol/HDL Ratio: 4.9 ratio — ABNORMAL HIGH (ref 0.0–4.4)
Cholesterol, Total: 205 mg/dL — ABNORMAL HIGH (ref 100–199)
HDL: 42 mg/dL (ref >39)
LDL Chol Calc (NIH): 149 mg/dL — ABNORMAL HIGH (ref 0–99)
Triglycerides: 79 mg/dL (ref 0–149)
VLDL Cholesterol Cal: 14 mg/dL (ref 5–40)

## 2023-01-19 LAB — RPR: RPR Ser Ql: NONREACTIVE

## 2023-01-19 LAB — CERVICOVAGINAL ANCILLARY ONLY
Chlamydia: NEGATIVE
Comment: NEGATIVE
Comment: NORMAL
Neisseria Gonorrhea: NEGATIVE

## 2023-01-19 LAB — HEMOGLOBIN A1C
Est. average glucose Bld gHb Est-mCnc: 123 mg/dL
Hgb A1c MFr Bld: 5.9 % — ABNORMAL HIGH (ref 4.8–5.6)

## 2023-01-19 LAB — CBC
Hematocrit: 33 % — ABNORMAL LOW (ref 34.0–46.6)
Hemoglobin: 10.4 g/dL — ABNORMAL LOW (ref 11.1–15.9)
MCH: 23.9 pg — ABNORMAL LOW (ref 26.6–33.0)
MCHC: 31.5 g/dL (ref 31.5–35.7)
MCV: 76 fL — ABNORMAL LOW (ref 79–97)
Platelets: 388 10*3/uL (ref 150–450)
RBC: 4.35 x10E6/uL (ref 3.77–5.28)
RDW: 16.9 % — ABNORMAL HIGH (ref 11.7–15.4)
WBC: 5.3 10*3/uL (ref 3.4–10.8)

## 2023-01-19 LAB — COMPREHENSIVE METABOLIC PANEL
ALT: 23 IU/L (ref 0–32)
AST: 22 IU/L (ref 0–40)
Albumin/Globulin Ratio: 1.2 (ref 1.2–2.2)
Albumin: 4.1 g/dL (ref 3.9–4.9)
Alkaline Phosphatase: 76 IU/L (ref 44–121)
BUN/Creatinine Ratio: 9 (ref 9–23)
BUN: 9 mg/dL (ref 6–20)
Bilirubin Total: 0.2 mg/dL (ref 0.0–1.2)
CO2: 22 mmol/L (ref 20–29)
Calcium: 9.4 mg/dL (ref 8.7–10.2)
Chloride: 102 mmol/L (ref 96–106)
Creatinine, Ser: 0.97 mg/dL (ref 0.57–1.00)
Globulin, Total: 3.3 g/dL (ref 1.5–4.5)
Glucose: 84 mg/dL (ref 70–99)
Potassium: 4.4 mmol/L (ref 3.5–5.2)
Sodium: 138 mmol/L (ref 134–144)
Total Protein: 7.4 g/dL (ref 6.0–8.5)
eGFR: 77 mL/min/{1.73_m2} (ref >59)

## 2023-01-19 LAB — HEPATITIS C ANTIBODY: Hep C Virus Ab: NONREACTIVE

## 2023-01-19 LAB — TSH: TSH: 2.33 u[IU]/mL (ref 0.450–4.500)

## 2023-01-19 LAB — HIV ANTIBODY (ROUTINE TESTING W REFLEX): HIV Screen 4th Generation wRfx: NONREACTIVE

## 2023-01-19 LAB — HEPATITIS B SURFACE ANTIGEN: Hepatitis B Surface Ag: NEGATIVE

## 2023-01-21 LAB — CYTOLOGY - PAP
Comment: NEGATIVE
Diagnosis: NEGATIVE
High risk HPV: NEGATIVE

## 2023-01-26 ENCOUNTER — Ambulatory Visit (HOSPITAL_BASED_OUTPATIENT_CLINIC_OR_DEPARTMENT_OTHER)
Admission: RE | Admit: 2023-01-26 | Discharge: 2023-01-26 | Disposition: A | Discharge status: Home / Self Care | Payer: Medicaid Other | Source: Ambulatory Visit | Attending: Obstetrics and Gynecology | Admitting: Obstetrics and Gynecology

## 2023-01-26 DIAGNOSIS — D259 Leiomyoma of uterus, unspecified: Secondary | ICD-10-CM | POA: Diagnosis present

## 2023-03-01 ENCOUNTER — Ambulatory Visit: Payer: Medicaid Other | Admitting: Obstetrics and Gynecology

## 2023-03-26 ENCOUNTER — Encounter: Payer: Self-pay | Admitting: Obstetrics and Gynecology

## 2023-03-26 ENCOUNTER — Ambulatory Visit (INDEPENDENT_AMBULATORY_CARE_PROVIDER_SITE_OTHER): Payer: 59 | Admitting: Obstetrics and Gynecology

## 2023-03-26 VITALS — BP 123/79 | HR 70 | Ht 68.0 in | Wt 285.0 lb

## 2023-03-26 DIAGNOSIS — Z712 Person consulting for explanation of examination or test findings: Secondary | ICD-10-CM | POA: Diagnosis not present

## 2023-03-26 NOTE — Progress Notes (Signed)
Pt presents for f/u. Pelvic US done 01-27-23. Pt reports improvement in flow with Lysteda but still having irregular periods.

## 2023-03-26 NOTE — Progress Notes (Signed)
37 yo P0 with LMP 03/12/23 and BMI 43 returning to follow up on AUB and discuss ultrasound results. Patient reports improvement in her cycle with lysteda. She describes her period as less heavy than previous. She is without any other complaints.   Past Medical History:  Diagnosis Date   Allergy    Past Surgical History:  Procedure Laterality Date   BIOPSY  10/02/2021   Procedure: BIOPSY;  Surgeon: Imogene Burn, MD;  Location: [facility] ENDOSCOPY;  Service: Gastroenterology;;   COLONOSCOPY WITH PROPOFOL N/A 10/02/2021   Procedure: COLONOSCOPY WITH PROPOFOL;  Surgeon: Imogene Burn, MD;  Location: [facility] ENDOSCOPY;  Service: Gastroenterology;  Laterality: N/A;   ESOPHAGOGASTRODUODENOSCOPY N/A 10/02/2021   Procedure: ESOPHAGOGASTRODUODENOSCOPY (EGD);  Surgeon: Imogene Burn, MD;  Location: [facility] ENDOSCOPY;  Service: Gastroenterology;  Laterality: N/A;   EYE MUSCLE SURGERY     x2   Family History  Problem Relation Age of Onset   Hypertension Mother    Arthritis Mother    Sleep apnea Mother    Liver disease Mother    Hypertension Father    Diabetes Father    Kidney disease Father    Arthritis Father    Liver disease Sister    Sleep apnea Brother    Cancer Maternal Grandmother        cervical   Cancer Paternal Grandfather        throat   Sleep apnea Sister    Hypertension Sister    Diabetes Sister    Social History   Tobacco Use   Smoking status: Some Days    Current packs/day: 0.25    Types: Cigarettes   Smokeless tobacco: Never   Tobacco comments:    Uses nicotine pouches  Vaping Use   Vaping status: Never Used  Substance Use Topics   Alcohol use: Yes    Alcohol/week: 3.0 standard drinks of alcohol    Types: 3 Shots of liquor per week    Comment: social   Drug use: No   ROS See pertinent in HPI. All other systems reviewed and non contributory Blood pressure 123/79, pulse 70, height 5\' 8"  (1.727 m), weight 285 lb (129.3 kg), last menstrual period 03/12/2023. GENERAL:  Well-developed, well-nourished female in no acute distress.  NEURO: alert and oriented x 3  US PELVIC COMPLETE WITH TRANSVAGINAL  Result Date: 01/27/2023 CLINICAL DATA:  Fibroid uterus EXAM: TRANSABDOMINAL AND TRANSVAGINAL ULTRASOUND OF PELVIS TECHNIQUE: Both transabdominal and transvaginal ultrasound examinations of the pelvis were performed. Transabdominal technique was performed for global imaging of the pelvis including uterus, ovaries, adnexal regions, and pelvic cul-de-sac. It was necessary to proceed with endovaginal exam following the transabdominal exam to visualize the uterus, endometrium, ovaries and adnexa. COMPARISON:  12/20/2019 FINDINGS: Uterus Measurements: 9.9 x 7.5 x 7.5 cm = volume: 294 mL. Large central fibroid measuring up to 6.4 cm. Endometrium Thickness: Not visualized due to large central fibroid. Right ovary Measurements: 3.2 x 1.9 x 2.6 cm = volume: 8 mL. Normal appearance/no adnexal mass. Left ovary Measurements: 4.3 x 2.1 x 2.9 cm = volume: 13 mL. Normal appearance/no adnexal mass. Other findings No abnormal free fluid. IMPRESSION: 6.4 cm central fibroid. No acute findings. Electronically Signed   By: Charlett Nose M.D.   On: 01/27/2023 00:53    A/P 37 yo with fibroid uterus and AUB - Reviewed ultrasound results with the patient - Reviewed management options including [place], [place] and myomectomy. Patient undecided at this time. She plans to continue with lysteda for now.  -  She does not want a hysterectomy and does not plan to carry a child - Patient will return when ready for further discussion

## 2023-05-11 ENCOUNTER — Telehealth: Payer: 59 | Admitting: Physician Assistant

## 2023-05-11 DIAGNOSIS — S0993XA Unspecified injury of face, initial encounter: Secondary | ICD-10-CM

## 2023-05-11 NOTE — Progress Notes (Signed)
Because of substantial pain with recent fracture and tooth and limitation of what we can prescribe for pain via e-visit, I feel your condition warrants further evaluation and I recommend that you be seen in a face to face visit.   NOTE: There will be NO CHARGE for this eVisit   If you are having a true medical emergency please call 911.      For an urgent face to face visit, [facility] has eight urgent care centers for your convenience:   NEW!! [facility] Health Urgent [facility] Center at [facility] Get Driving Directions [phone] [address] [place], [address]    [facility] Health Urgent Care Center at [facility] Get Driving Directions [phone] [address] [place], [place] [address]   [facility] Health Urgent Care Center [facility]) Get Driving Directions [phone] [address] [place], [place] [address]  [facility] Health Urgent Care Center [facility]) Get Driving Directions [phone] [address] [place],  [place]  [address]  [facility] Health Urgent Care Center [facility] - at [employer]  [phone] [address] [place],  [place] [address]   [facility] Health Urgent Care at [facility] Get Driving Directions [phone] [address] [place], [place] [address]   [facility] Health Urgent Care at [facility] Get Driving Directions  [phone] [address] [place], [place] [address]   [facility] Health Urgent Care at [facility] Directions [phone] [address] [place], [place] [address]  Your MyChart E-visit questionnaire answers were reviewed by a board certified advanced clinical practitioner to complete your personal care plan based on your specific symptoms.  Thank you for using e-Visits.

## 2023-08-12 ENCOUNTER — Telehealth: Payer: 59 | Admitting: Physician Assistant

## 2023-08-12 DIAGNOSIS — K047 Periapical abscess without sinus: Secondary | ICD-10-CM

## 2023-08-12 MED ORDER — NAPROXEN 500 MG PO TABS
500.0000 mg | ORAL_TABLET | Freq: Two times a day (BID) | ORAL | 0 refills | Status: DC
Start: 2023-08-12 — End: 2024-01-27

## 2023-08-12 MED ORDER — AMOXICILLIN-POT CLAVULANATE 875-125 MG PO TABS
1.0000 | ORAL_TABLET | Freq: Two times a day (BID) | ORAL | 0 refills | Status: DC
Start: 2023-08-12 — End: 2024-01-27

## 2023-08-12 NOTE — Progress Notes (Signed)
I have spent 5 minutes in review of e-visit questionnaire, review and updating patient chart, medical decision making and response to patient.   Mia Milan Cody Jacklynn Dehaas, PA-C    

## 2023-08-12 NOTE — Progress Notes (Signed)

## 2024-01-27 ENCOUNTER — Telehealth: Admitting: Physician Assistant

## 2024-01-27 DIAGNOSIS — K047 Periapical abscess without sinus: Secondary | ICD-10-CM | POA: Diagnosis not present

## 2024-01-27 MED ORDER — NAPROXEN 500 MG PO TABS: 500.0000 mg | ORAL_TABLET | Freq: Two times a day (BID) | ORAL | 0 refills | Status: AC

## 2024-01-27 MED ORDER — AMOXICILLIN-POT CLAVULANATE 875-125 MG PO TABS: 1.0000 | ORAL_TABLET | Freq: Two times a day (BID) | ORAL | 0 refills | Status: AC

## 2024-01-27 NOTE — Progress Notes (Signed)
 I have spent 5 minutes in review of e-visit questionnaire, review and updating patient chart, medical decision making and response to patient.   Piedad Climes, PA-C

## 2024-01-27 NOTE — Progress Notes (Signed)

## 2024-09-19 ENCOUNTER — Telehealth: Admitting: Emergency Medicine

## 2024-09-19 DIAGNOSIS — K047 Periapical abscess without sinus: Secondary | ICD-10-CM | POA: Diagnosis not present

## 2024-09-19 MED ORDER — PENICILLIN V POTASSIUM 500 MG PO TABS: 500.0000 mg | ORAL_TABLET | Freq: Three times a day (TID) | ORAL | 0 refills | Status: AC

## 2024-09-19 NOTE — Progress Notes (Signed)
 E-Visit for Dental Pain  We are sorry that you are not feeling well.  Here is how we plan to help!  Based on what you have shared with me in the questionnaire, it sounds like you have a broken tooth with a dental infection  Pen VK 500mg  3 times a day for 7 days  It is imperative that you see a dentist within 10 days of this eVisit to determine the cause of the dental pain and be sure it is adequately treated  A toothache or tooth pain is caused when the nerve in the root of a tooth or surrounding a tooth is irritated. Dental (tooth) infection, decay, injury, or loss of a tooth are the most common causes of dental pain. Pain may also occur after an extraction (tooth is pulled out). Pain sometimes originates from other areas and radiates to the jaw, thus appearing to be tooth pain.Bacteria growing inside your mouth can contribute to gum disease and dental decay, both of which can cause pain. A toothache occurs from inflammation of the central portion of the tooth called pulp. The pulp contains nerve endings that are very sensitive to pain. Inflammation to the pulp or pulpitis may be caused by dental cavities, trauma, and infection.    HOME CARE:   For toothaches: Over-the-counter pain medications such as acetaminophen  or ibuprofen  may be used. Take these as directed on the package while you arrange for a dental appointment. Avoid very cold or hot foods, because they may make the pain worse. You may get relief from biting on a cotton ball soaked in oil of cloves. You can get oil of cloves at most drug stores.  For jaw pain:  Aspirin may be helpful for problems in the joint of the jaw in adults. If pain happens every time you open your mouth widely, the temporomandibular joint (TMJ) may be the source of the pain. Yawning or taking a large bite of food may worsen the pain. An appointment with your doctor or dentist will help you find the cause.     GET HELP RIGHT AWAY IF:  You have a high  fever or chills If you have had a recent head or face injury and develop headache, light headedness, nausea, vomiting, or other symptoms that concern you after an injury to your face or mouth, you could have a more serious injury in addition to your dental injury. A facial rash associated with a toothache: This condition may improve with medication. Contact your doctor for them to decide what is appropriate. Any jaw pain occurring with chest pain: Although jaw pain is most commonly caused by dental disease, it is sometimes referred pain from other areas. People with heart disease, especially people who have had stents placed, people with diabetes, or those who have had heart surgery may have jaw pain as a symptom of heart attack or angina. If your jaw or tooth pain is associated with lightheadedness, sweating, or shortness of breath, you should see a doctor as soon as possible. Trouble swallowing or excessive pain or bleeding from gums: If you have a history of a weakened immune system, diabetes, or steroid use, you may be more susceptible to infections. Infections can often be more severe and extensive or caused by unusual organisms. Dental and gum infections in people with these conditions may require more aggressive treatment. An abscess may need draining or IV antibiotics, for example.  MAKE SURE YOU   Understand these instructions. Will watch your condition. Will get  help right away if you are not doing well or get worse.  Thank you for choosing an e-visit.  Your e-visit answers were reviewed by a board certified advanced clinical practitioner to complete your personal care plan. Depending upon the condition, your plan could have included both over the counter or prescription medications.  Please review your pharmacy choice. Make sure the pharmacy is open so you can pick up prescription now. If there is a problem, you may contact your provider through [employer] and have the prescription  routed to another pharmacy.  Your safety is important to us . If you have drug allergies check your prescription carefully.   For the next 24 hours you can use MyChart to ask questions about today's visit, request a non-urgent call back, or ask for a work or school excuse. You will get an email in the next two days asking about your experience. I hope that your e-visit has been valuable and will speed your recovery.  I have spent 5 minutes in review of e-visit questionnaire, review and updating patient chart, medical decision making and response to patient.   Jon Belt, PhD, FNP-BC
